# Patient Record
Sex: Male | Born: 1945 | Race: White | Hispanic: No | Smoking: Never smoker
Health system: Southern US, Community
[De-identification: ages and names within clinical notes are randomized; demographics above are authoritative.]

## PROBLEM LIST (undated history)

## (undated) DIAGNOSIS — D696 Thrombocytopenia, unspecified: Secondary | ICD-10-CM

## (undated) DIAGNOSIS — D869 Sarcoidosis, unspecified: Secondary | ICD-10-CM

## (undated) DIAGNOSIS — I2510 Atherosclerotic heart disease of native coronary artery without angina pectoris: Secondary | ICD-10-CM

## (undated) DIAGNOSIS — G250 Essential tremor: Secondary | ICD-10-CM

## (undated) DIAGNOSIS — M545 Low back pain, unspecified: Secondary | ICD-10-CM

## (undated) DIAGNOSIS — M51369 Other intervertebral disc degeneration, lumbar region without mention of lumbar back pain or lower extremity pain: Secondary | ICD-10-CM

## (undated) DIAGNOSIS — G8929 Other chronic pain: Secondary | ICD-10-CM

## (undated) DIAGNOSIS — N189 Chronic kidney disease, unspecified: Secondary | ICD-10-CM

## (undated) DIAGNOSIS — N183 Chronic kidney disease, stage 3 unspecified: Secondary | ICD-10-CM

## (undated) DIAGNOSIS — M5136 Other intervertebral disc degeneration, lumbar region: Secondary | ICD-10-CM

## (undated) DIAGNOSIS — I350 Nonrheumatic aortic (valve) stenosis: Secondary | ICD-10-CM

## (undated) DIAGNOSIS — E785 Hyperlipidemia, unspecified: Secondary | ICD-10-CM

## (undated) DIAGNOSIS — Z952 Presence of prosthetic heart valve: Secondary | ICD-10-CM

## (undated) DIAGNOSIS — I10 Essential (primary) hypertension: Secondary | ICD-10-CM

## (undated) HISTORY — DX: Atherosclerotic heart disease of native coronary artery without angina pectoris: I25.10

## (undated) HISTORY — PX: INGUINAL HERNIA REPAIR: SUR1180

## (undated) HISTORY — DX: Other intervertebral disc degeneration, lumbar region without mention of lumbar back pain or lower extremity pain: M51.369

## (undated) HISTORY — DX: Sarcoidosis, unspecified: D86.9

## (undated) HISTORY — PX: HAMMER TOE SURGERY: SHX385

## (undated) HISTORY — DX: Chronic kidney disease, stage 3 unspecified: N18.30

## (undated) HISTORY — DX: Thrombocytopenia, unspecified: D69.6

## (undated) HISTORY — DX: Other intervertebral disc degeneration, lumbar region: M51.36

## (undated) HISTORY — PX: COLONOSCOPY: SHX174

## (undated) HISTORY — DX: Essential (primary) hypertension: I10

## (undated) HISTORY — PX: LAPAROSCOPIC CHOLECYSTECTOMY: SUR755

## (undated) HISTORY — DX: Hyperlipidemia, unspecified: E78.5

---
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
## 2016-06-27 ENCOUNTER — Encounter: Payer: Self-pay | Admitting: Cardiovascular Disease

## 2016-07-13 NOTE — Progress Notes (Signed)
Cardiology Office Note   Date:  07/15/2016   ID:  Jason Anthony, DOB 1945-12-27, MRN [id number]  PCP:  Bedelia Person, MD  Cardiologist:   Charlton Haws, MD   No chief complaint on file.     History of Present Illness: Jason Anthony is a 71 y.o. male who presents for consultation regarding aortic valve disease. Seen at request of Dr Mickel Duhamel Previously followed at [facility] in [place] Chronic back pain, Thrombocytopenia, Essential Tremor, Anxiety, HTN With CRF baseline Cr around 1.5.    Reviewed echo report from [facility] 05/06/16  EF 60-65% Mild LVH Severe AS with mild AR mean gradient 36 mmHg peak 64 mmHg progression since 05/29/15  AVA No flowsheet data found.  cm2 Mild MR and mild TR  He is still working at [employer] one of my patients No chest pain dyspnea or palpitations. Active for his age     Past Medical History:  Diagnosis Date  . Acute renal failure (HCC)   . Anxiety   . Aortic stenosis   . B12 deficiency   . Bilateral impacted cerumen   . DDD (degenerative disc disease), lumbar   . DOE (dyspnea on exertion)   . Factitious dermatitis   . Gout   . Hepatitis   . Hyperglycemia   . Hyperkalemia   . Hyperlipidemia   . Hypertension   . Joint pain in the shoulder/clavicle region   . Low back pain   . Occasional tremors   . Sarcoidosis   . Thrombocytopenia (HCC)     Past Surgical History:  Procedure Laterality Date  . CHOLECYSTECTOMY    . HERNIA REPAIR    . THYROID SURGERY    . TOE SURGERY       Current Outpatient Prescriptions  Medication Sig Dispense Refill  . clonazePAM (KLONOPIN) 0.5 MG tablet Take 0.5 mg by mouth 2 (two) times daily as needed for anxiety.    Marland Kitchen losartan (COZAAR) 100 MG tablet Take 100 mg by mouth daily.    . simvastatin (ZOCOR) 40 MG tablet Take 40 mg by mouth daily.    . traZODone (DESYREL) 50 MG tablet Take 50 mg by mouth at bedtime. TAKE 1 TO 2 TABLETS BY MOUTH AT BEDTIME.    Marland Kitchen  triamcinolone cream (KENALOG) 0.1 % Apply 1 application topically 2 (two) times daily.    Marland Kitchen aspirin EC 81 MG tablet Take 1 tablet (81 mg total) by mouth daily. 90 tablet 3   No current facility-administered medications for this visit.     Allergies:   Patient has no known allergies.    Social History:  The patient  reports that he has never smoked. He has never used smokeless tobacco. He reports that he does not drink alcohol or use drugs.   Family History:  The patient's family history includes Breast cancer in his sister; CVA (age of onset: 33) in his father; Hypertension in his mother.    ROS:  Please see the history of present illness.   Otherwise, review of systems are positive for none.   All other systems are reviewed and negative.    PHYSICAL EXAM: VS:  BP 134/70   Pulse (!) 56   Ht 6\' 2"  (1.88 m)   Wt 211 lb 9.6 oz (96 kg)   BMI 27.17 kg/m  , BMI Body mass index is 27.17 kg/m. Affect appropriate Healthy:  appears stated age HEENT: normal Neck supple with no adenopathy JVP normal no  bruits no thyromegaly Lungs clear with no wheezing and good diaphragmatic motion Heart:  S1/S2 AS  murmur, no rub, gallop or click PMI normal Abdomen: benighn, BS positve, no tenderness, no AAA no bruit.  No HSM or HJR Distal pulses intact with no bruits No edema Neuro non-focal Skin warm and dry No muscular weakness    EKG:   SR rate 69 LAFB LVH   Recent Labs: No results found for requested labs within last 8760 hours.    Lipid Panel No results found for: CHOL, TRIG, HDL, CHOLHDL, VLDL, LDLCALC, LDLDIRECT    Wt Readings from Last 3 Encounters:  07/15/16 211 lb 9.6 oz (96 kg)      Other studies Reviewed: Additional studies/ records that were reviewed today include: Notes from primary Dr Sherryll BurgerShah in [place] and echo .    ASSESSMENT AND PLAN:  1. Aortic Stenosis : just borderline for gradient but essentially at severe level but asymptomatic. Lexiscan myovue To  r/o CAD F/U echo here in 6 months Explained to him that most patients become symptomatic and require Surgery within 2 years of severe AS.  2. HTN Well controlled.  Continue current medications and low sodium Dash type diet.   3. Cholesterol continue statin labs with primary    Current medicines are reviewed at length with the patient today.  The patient does not have concerns regarding medicines.  The following changes have been made:  no change  Labs/ tests ordered today include: Lex Myovue Echo in 6 months   Orders Placed This Encounter  Procedures  . Myocardial Perfusion Imaging  . EKG 12-Lead  . ECHOCARDIOGRAM COMPLETE     Disposition:   FU with me in 6 months      Signed, Charlton HawsPeter Terrence Wishon, MD  07/15/2016 2:23 PM    [facility] [address], [place], [place]  [address] Phone: [phone]; Fax: [phone]

## 2016-07-15 ENCOUNTER — Ambulatory Visit (INDEPENDENT_AMBULATORY_CARE_PROVIDER_SITE_OTHER): Payer: Medicare PPO | Admitting: Cardiovascular Disease

## 2016-07-15 ENCOUNTER — Encounter: Payer: Self-pay | Admitting: Cardiovascular Disease

## 2016-07-15 ENCOUNTER — Encounter (INDEPENDENT_AMBULATORY_CARE_PROVIDER_SITE_OTHER): Payer: Self-pay

## 2016-07-15 VITALS — BP 134/70 | HR 56 | Ht 74.0 in | Wt 211.6 lb

## 2016-07-15 DIAGNOSIS — I350 Nonrheumatic aortic (valve) stenosis: Secondary | ICD-10-CM

## 2016-07-15 MED ORDER — ASPIRIN EC 81 MG PO TBEC: 81.0000 mg | DELAYED_RELEASE_TABLET | Freq: Every day | ORAL | 3 refills | Status: DC

## 2016-07-15 NOTE — Patient Instructions (Addendum)
Medication Instructions:  Your physician recommends that you continue on your current medications as directed. Please refer to the Current Medication list given to you today.  Labwork: NONE  Testing/Procedures: Your physician has requested that you have an echocardiogram in 6 months with office visit. Echocardiography is a painless test that uses sound waves to create images of your heart. It provides your doctor with information about the size and shape of your heart and how well your heart's chambers and valves are working. This procedure takes approximately one hour. There are no restrictions for this procedure.  Your physician has requested that you have a lexiscan myoview. For further information please visit [email or web address]. Please follow instruction sheet, as given.  Follow-Up: Your physician wants you to follow-up in: 6 months with Dr. Eden EmmsNishan. You will receive a reminder letter in the mail two months in advance. If you don't receive a letter, please call our office to schedule the follow-up appointment.   If you need a refill on your cardiac medications before your next appointment, please call your pharmacy.

## 2016-07-29 ENCOUNTER — Telehealth (HOSPITAL_COMMUNITY): Payer: Self-pay | Admitting: *Deleted

## 2016-07-29 NOTE — Telephone Encounter (Signed)
Patient given detailed instructions per Myocardial Perfusion Study Information Sheet for the test on 07/31/16. Patient notified to arrive 15 minutes early and that it is imperative to arrive on time for appointment to keep from having the test rescheduled.  If you need to cancel or reschedule your appointment, please call the office within 24 hours of your appointment. . Patient verbalized understanding. Lily Kernen Jacqueline    

## 2016-07-31 ENCOUNTER — Ambulatory Visit (HOSPITAL_COMMUNITY): Payer: Medicare PPO | Attending: Internal Medicine

## 2016-07-31 DIAGNOSIS — I350 Nonrheumatic aortic (valve) stenosis: Secondary | ICD-10-CM

## 2016-07-31 DIAGNOSIS — R9439 Abnormal result of other cardiovascular function study: Secondary | ICD-10-CM | POA: Diagnosis not present

## 2016-07-31 LAB — MYOCARDIAL PERFUSION IMAGING
CHL CUP NUCLEAR SRS: 5
LV sys vol: 71 mL
LVDIAVOL: 143 mL (ref 62–150)
NUC STRESS TID: 1.1
Peak HR: 73 {beats}/min
RATE: 0.28
Rest HR: 53 {beats}/min
SDS: 3
SSS: 8

## 2016-07-31 MED ORDER — TECHNETIUM TC 99M TETROFOSMIN IV KIT
955.0000 | PACK | Freq: Once | INTRAVENOUS | Status: AC | PRN
  Administered 2016-07-31: 955 via INTRAVENOUS
  Filled 2016-07-31: qty 955

## 2016-07-31 MED ORDER — TECHNETIUM TC 99M TETROFOSMIN IV KIT
32.8000 | PACK | Freq: Once | INTRAVENOUS | Status: AC | PRN
  Administered 2016-07-31: 32.8 via INTRAVENOUS
  Filled 2016-07-31: qty 33

## 2016-07-31 MED ORDER — REGADENOSON 0.4 MG/5ML IV SOLN
0.4000 mg | Freq: Once | INTRAVENOUS | Status: AC
  Administered 2016-07-31: 0.4 mg via INTRAVENOUS

## 2016-11-22 ENCOUNTER — Telehealth (HOSPITAL_COMMUNITY): Payer: Self-pay | Admitting: Cardiovascular Disease

## 2016-11-22 NOTE — Telephone Encounter (Signed)
I called Mr. Jason Anthony and spoke with him to get him scheduled for an echo in January but he expressed that he wanted to wait until April to be scheduled due to him not knowing what the weather will be like.

## 2017-04-15 NOTE — Progress Notes (Signed)
Cardiology Office Note   Date:  04/23/2017   ID:  Jason Anthony, DOB 07/23/1945, MRN [id number]  PCP:  Bedelia PersonAaron, Caren T, MD  Cardiologist:   Charlton HawsPeter Nevin Kozuch, MD   No chief complaint on file.     History of Present Illness:  72 y.o. first seen July 2018 for aortic stenosis. Previously followed at [facility] in [place]. Co morbid conditions include chronic back pain, Thrombocytopenia, HTN , CRF with baseline CR 1.5 and Essential Tremor No history of chest pain or CAD Myovue done 07/31/16 diaphragmatic attenuation EF 51% low risk Was supposed to have f/u echo but has not been done yet.   Reviewed echo report from [facility] [place] 05/06/16  EF 60-65% Mild LVH Severe AS with mild AR mean gradient 36 mmHg peak 64 mmHg progression since 05/29/15  AVA  .61  cm2 Mild MR and mild TR  He is still working at [employer] Knows Lawrence Seaney one of my patients No chest pain dyspnea or palpitations. Active for his age Had bronchitis and  Multiple courses of prednisone and antibiotics then got C diff and was hospitalized With dehydration   Reviewed his preliminary echo from today and mean gradient is over 49 mmHg now.  Discussed need for right and left cath and risks including stroke, bleeding , contrast allergy And need for emergency surgery. His "girlfriend's" daughter is with him and can provide transportation He has concerns about his hospital bill from recent stay in [place] for C diff  Echo from today 04/23/17 Study Conclusions  - Left ventricle: The cavity size was normal. Wall thickness was   increased in a pattern of mild LVH. Systolic function was normal.   The estimated ejection fraction was in the range of 60% to 65%.   Doppler parameters are consistent with abnormal left ventricular   relaxation (grade 1 diastolic dysfunction). - Aortic valve: There was critical stenosis. There was trivial   regurgitation. Mean gradient (S): 49 mm Hg. Peak gradient (S): 90   mm  Hg. - Mitral valve: Calcified annulus. - Left atrium: The atrium was moderately dilated. - Atrial septum: No defect or patent foramen ovale was identified.  Past Medical History:  Diagnosis Date  . Acute renal failure (HCC)   . Anxiety   . Aortic stenosis   . B12 deficiency   . Bilateral impacted cerumen   . DDD (degenerative disc disease), lumbar   . DOE (dyspnea on exertion)   . Factitious dermatitis   . Gout   . Hepatitis   . Hyperglycemia   . Hyperkalemia   . Hyperlipidemia   . Hypertension   . Joint pain in the shoulder/clavicle region   . Low back pain   . Occasional tremors   . Sarcoidosis   . Thrombocytopenia (HCC)     Past Surgical History:  Procedure Laterality Date  . CHOLECYSTECTOMY    . HERNIA REPAIR    . THYROID SURGERY    . TOE SURGERY       Current Outpatient Medications  Medication Sig Dispense Refill  . aspirin EC 81 MG tablet Take 1 tablet (81 mg total) by mouth daily. 90 tablet 3  . clonazePAM (KLONOPIN) 0.5 MG tablet Take 0.5 mg by mouth 2 (two) times daily as needed for anxiety.    . simvastatin (ZOCOR) 40 MG tablet Take 40 mg by mouth daily.    . traZODone (DESYREL) 50 MG tablet Take 50 mg by mouth at bedtime. TAKE 1 TO 2 TABLETS  BY MOUTH AT BEDTIME.    Marland Kitchen losartan (COZAAR) 50 MG tablet Take 1 tablet (50 mg total) by mouth daily. 90 tablet 3   No current facility-administered medications for this visit.     Allergies:   Patient has no known allergies.    Social History:  The patient  reports that he has never smoked. He has never used smokeless tobacco. He reports that he does not drink alcohol or use drugs.   Family History:  The patient's family history includes Breast cancer in his sister; CVA (age of onset: 19) in his father; Hypertension in his mother.    ROS:  Please see the history of present illness.   Otherwise, review of systems are positive for none.   All other systems are reviewed and negative.    PHYSICAL EXAM: VS:  BP  102/60   Pulse 79   Ht 6\' 2"  (1.88 m)   Wt 203 lb (92.1 kg)   SpO2 97%   BMI 26.06 kg/m  , BMI Body mass index is 26.06 kg/m. Affect appropriate Overweight  HEENT: normal Neck supple with no adenopathy JVP normal no bruits no thyromegaly Lungs clear with no wheezing and good diaphragmatic motion Heart:  S1/S2 AS  murmur, no rub, gallop or click PMI normal Abdomen: benighn, BS positve, no tenderness, no AAA no bruit.  No HSM or HJR Distal pulses intact with no bruits No edema Neuro non-focal Skin warm and dry No muscular weakness   EKG:   SR rate 69 LAFB LVH   Recent Labs: No results found for requested labs within last 8760 hours.    Lipid Panel No results found for: CHOL, TRIG, HDL, CHOLHDL, VLDL, LDLCALC, LDLDIRECT    Wt Readings from Last 3 Encounters:  04/23/17 203 lb (92.1 kg)  07/31/16 211 lb (95.7 kg)  07/15/16 211 lb 9.6 oz (96 kg)      Other studies Reviewed: Additional studies/ records that were reviewed today include: Notes from primary Dr Sherryll Burger in [place] ECG and echo .    ASSESSMENT AND PLAN:  1. Aortic Stenosis : Severe/Critical and progressive by echo today with mean gradient 49 mmHg Have arranged right and left cath with Dr Sanjuana Kava 04/30/17 Labs today orders done will then See CVTS following week to discuss timing of surgery . Patient concerned about finances 2. HTN low with dizziness decrease cozaar to 50 mg   3. Cholesterol continue statin labs with primary    Current medicines are reviewed at length with the patient today.  The patient does not have concerns regarding medicines.  The following changes have been made:  no change  Labs/ tests ordered today include: Pre cath   Orders Placed This Encounter  Procedures  . CBC with Differential/Platelet  . Basic metabolic panel  . Protime-INR  . Ambulatory referral to Cardiothoracic Surgery  . ECHOCARDIOGRAM COMPLETE     Disposition:   FU with me post cath        Signed, Charlton Haws, MD  04/23/2017 3:39 PM    [facility] [address] [place], [place], [place]  [address] Phone: [phone]; Fax: [phone]

## 2017-04-15 NOTE — H&P (View-Only) (Signed)
Cardiology Office Note   Date:  04/23/2017   ID:  Dudley MajorJames Earlywine, DOB 07/23/1945, MRN [id number]  PCP:  Bedelia PersonAaron, Caren T, MD  Cardiologist:   Charlton HawsPeter Nishan, MD   No chief complaint on file.     History of Present Illness:  72 y.o. first seen July 2018 for aortic stenosis. Previously followed at [facility] [place]. Co morbid conditions include chronic back pain, Thrombocytopenia, HTN , CRF with baseline CR 1.5 and Essential Tremor No history of chest pain or CAD Myovue done 07/31/16 diaphragmatic attenuation EF 51% low risk Was supposed to have f/u echo but has not been done yet.   Reviewed echo report from [facility] 05/06/16  EF 60-65% Mild LVH Severe AS with mild AR mean gradient 36 mmHg peak 64 mmHg progression since 05/29/15  AVA  .61  cm2 Mild MR and mild TR  He is still working at [employer] Knows Lawrence Seaney one of my patients No chest pain dyspnea or palpitations. Active for his age Had bronchitis and  Multiple courses of prednisone and antibiotics then got C diff and was hospitalized With dehydration   Reviewed his preliminary echo from today and mean gradient is over 49 mmHg now.  Discussed need for right and left cath and risks including stroke, bleeding , contrast allergy And need for emergency surgery. His "girlfriend's" daughter is with him and can provide transportation He has concerns about his hospital bill from recent stay in [place] for C diff  Echo from today 04/23/17 Study Conclusions  - Left ventricle: The cavity size was normal. Wall thickness was   increased in a pattern of mild LVH. Systolic function was normal.   The estimated ejection fraction was in the range of 60% to 65%.   Doppler parameters are consistent with abnormal left ventricular   relaxation (grade 1 diastolic dysfunction). - Aortic valve: There was critical stenosis. There was trivial   regurgitation. Mean gradient (S): 49 mm Hg. Peak gradient (S): 90   mm  Hg. - Mitral valve: Calcified annulus. - Left atrium: The atrium was moderately dilated. - Atrial septum: No defect or patent foramen ovale was identified.  Past Medical History:  Diagnosis Date  . Acute renal failure (HCC)   . Anxiety   . Aortic stenosis   . B12 deficiency   . Bilateral impacted cerumen   . DDD (degenerative disc disease), lumbar   . DOE (dyspnea on exertion)   . Factitious dermatitis   . Gout   . Hepatitis   . Hyperglycemia   . Hyperkalemia   . Hyperlipidemia   . Hypertension   . Joint pain in the shoulder/clavicle region   . Low back pain   . Occasional tremors   . Sarcoidosis   . Thrombocytopenia (HCC)     Past Surgical History:  Procedure Laterality Date  . CHOLECYSTECTOMY    . HERNIA REPAIR    . THYROID SURGERY    . TOE SURGERY       Current Outpatient Medications  Medication Sig Dispense Refill  . aspirin EC 81 MG tablet Take 1 tablet (81 mg total) by mouth daily. 90 tablet 3  . clonazePAM (KLONOPIN) 0.5 MG tablet Take 0.5 mg by mouth 2 (two) times daily as needed for anxiety.    . simvastatin (ZOCOR) 40 MG tablet Take 40 mg by mouth daily.    . traZODone (DESYREL) 50 MG tablet Take 50 mg by mouth at bedtime. TAKE 1 TO 2 TABLETS  BY MOUTH AT BEDTIME.    Marland Kitchen losartan (COZAAR) 50 MG tablet Take 1 tablet (50 mg total) by mouth daily. 90 tablet 3   No current facility-administered medications for this visit.     Allergies:   Patient has no known allergies.    Social History:  The patient  reports that he has never smoked. He has never used smokeless tobacco. He reports that he does not drink alcohol or use drugs.   Family History:  The patient's family history includes Breast cancer in his sister; CVA (age of onset: 19) in his father; Hypertension in his mother.    ROS:  Please see the history of present illness.   Otherwise, review of systems are positive for none.   All other systems are reviewed and negative.    PHYSICAL EXAM: VS:  BP  102/60   Pulse 79   Ht 6\' 2"  (1.88 m)   Wt 203 lb (92.1 kg)   SpO2 97%   BMI 26.06 kg/m  , BMI Body mass index is 26.06 kg/m. Affect appropriate Overweight  HEENT: normal Neck supple with no adenopathy JVP normal no bruits no thyromegaly Lungs clear with no wheezing and good diaphragmatic motion Heart:  S1/S2 AS  murmur, no rub, gallop or click PMI normal Abdomen: benighn, BS positve, no tenderness, no AAA no bruit.  No HSM or HJR Distal pulses intact with no bruits No edema Neuro non-focal Skin warm and dry No muscular weakness   EKG:   SR rate 69 LAFB LVH   Recent Labs: No results found for requested labs within last 8760 hours.    Lipid Panel No results found for: CHOL, TRIG, HDL, CHOLHDL, VLDL, LDLCALC, LDLDIRECT    Wt Readings from Last 3 Encounters:  04/23/17 203 lb (92.1 kg)  07/31/16 211 lb (95.7 kg)  07/15/16 211 lb 9.6 oz (96 kg)      Other studies Reviewed: Additional studies/ records that were reviewed today include: Notes from primary Dr Sherryll Burger in [place] ECG and echo .    ASSESSMENT AND PLAN:  1. Aortic Stenosis : Severe/Critical and progressive by echo today with mean gradient 49 mmHg Have arranged right and left cath with Dr Sanjuana Kava 04/30/17 Labs today orders done will then See CVTS following week to discuss timing of surgery . Patient concerned about finances 2. HTN low with dizziness decrease cozaar to 50 mg   3. Cholesterol continue statin labs with primary    Current medicines are reviewed at length with the patient today.  The patient does not have concerns regarding medicines.  The following changes have been made:  no change  Labs/ tests ordered today include: Pre cath   Orders Placed This Encounter  Procedures  . CBC with Differential/Platelet  . Basic metabolic panel  . Protime-INR  . Ambulatory referral to Cardiothoracic Surgery  . ECHOCARDIOGRAM COMPLETE     Disposition:   FU with me post cath        Signed, Charlton Haws, MD  04/23/2017 3:39 PM    [facility] [address] [place], [place], [place]  [address] Phone: [phone]; Fax: [phone]

## 2017-04-23 ENCOUNTER — Encounter: Payer: Self-pay | Admitting: Cardiovascular Disease

## 2017-04-23 ENCOUNTER — Ambulatory Visit (HOSPITAL_COMMUNITY): Payer: Medicare PPO | Attending: Cardiology

## 2017-04-23 ENCOUNTER — Ambulatory Visit: Payer: Medicare PPO | Admitting: Cardiovascular Disease

## 2017-04-23 ENCOUNTER — Other Ambulatory Visit: Payer: Self-pay

## 2017-04-23 ENCOUNTER — Other Ambulatory Visit (HOSPITAL_COMMUNITY): Payer: Self-pay | Admitting: Cardiovascular Disease

## 2017-04-23 VITALS — BP 102/60 | HR 79 | Ht 74.0 in | Wt 203.0 lb

## 2017-04-23 DIAGNOSIS — E7849 Other hyperlipidemia: Secondary | ICD-10-CM

## 2017-04-23 DIAGNOSIS — R0600 Dyspnea, unspecified: Secondary | ICD-10-CM | POA: Diagnosis not present

## 2017-04-23 DIAGNOSIS — E785 Hyperlipidemia, unspecified: Secondary | ICD-10-CM | POA: Diagnosis not present

## 2017-04-23 DIAGNOSIS — I119 Hypertensive heart disease without heart failure: Secondary | ICD-10-CM | POA: Diagnosis not present

## 2017-04-23 DIAGNOSIS — I10 Essential (primary) hypertension: Secondary | ICD-10-CM

## 2017-04-23 DIAGNOSIS — I350 Nonrheumatic aortic (valve) stenosis: Secondary | ICD-10-CM | POA: Diagnosis present

## 2017-04-23 DIAGNOSIS — D869 Sarcoidosis, unspecified: Secondary | ICD-10-CM | POA: Insufficient documentation

## 2017-04-23 LAB — ECHOCARDIOGRAM COMPLETE
Height: 74 in
Weight: 3248 oz

## 2017-04-23 MED ORDER — LOSARTAN POTASSIUM 50 MG PO TABS: 50.0000 mg | ORAL_TABLET | Freq: Every day | ORAL | 3 refills | Status: DC

## 2017-04-23 NOTE — Patient Instructions (Addendum)
Medication Instructions:  Your physician has recommended you make the following change in your medication:  1-Losartan 50 mg by mouth daily.   Labwork: Your physician recommends that you have lab work done today- BMET, CBC, PT/INR  Testing/Procedures: Your physician has requested that you have a cardiac catheterization. Cardiac catheterization is used to diagnose and/or treat various heart conditions. Doctors may recommend this procedure for a number of different reasons. The most common reason is to evaluate chest pain. Chest pain can be a symptom of coronary artery disease (CAD), and cardiac catheterization can show whether plaque is narrowing or blocking your heart's arteries. This procedure is also used to evaluate the valves, as well as measure the blood flow and oxygen levels in different parts of your heart. For further information please visit [email or web address] Please follow instruction sheet, as given.  Follow-Up: Your physician wants you to follow-up in: 3 weeks with PA or NP.  You have been referred to Dr. Cornelius Moraswen for Aortic Stenosis.   If you need a refill on your cardiac medications before your next appointment, please call your pharmacy.    [facility] [address] [address] OFF[facility] [address], [address] [place] [address] Dept: [phone] Loc: [phone]  Dudley MajorJames Anschutz  04/23/2017  You are scheduled for a Cardiac Catheterization on Wednesday, April 24 with Dr. Verne Carrowhristopher McAlhany.  1. [facility] at [facility] : [address] [place], [place] [address] at 6:30 AM (two hours before your procedure to ensure your preparation). Free valet parking service is available.   Special note: Every effort is made to have your procedure done on time. Please understand that emergencies sometimes delay scheduled procedures.  2. Diet: Do not eat or drink anything after  midnight prior to your procedure except sips of water to take medications.  3. Labs: None needed.  4. Medication instructions in preparation for your procedure:  On the morning of your procedure, take your Aspirin and any morning medicines NOT listed above.  You may use sips of water.  5. Plan for one night stay--bring personal belongings. 6. Bring a current list of your medications and current insurance cards. 7. You MUST have a responsible person to drive you home. 8. Someone MUST be with you the first 24 hours after you arrive home or your discharge will be delayed. 9. Please wear clothes that are easy to get on and off and wear slip-on shoes.  Thank you for allowing us to care for you!   -- [facility]

## 2017-04-24 ENCOUNTER — Telehealth: Payer: Self-pay | Admitting: Cardiology

## 2017-04-24 LAB — CBC WITH DIFFERENTIAL/PLATELET
BASOS: 0 %
Basophils Absolute: 0 10*3/uL (ref 0.0–0.2)
EOS (ABSOLUTE): 0.1 10*3/uL (ref 0.0–0.4)
Eos: 2 %
HEMATOCRIT: 36.7 % — AB (ref 37.5–51.0)
Hemoglobin: 11.8 g/dL — ABNORMAL LOW (ref 13.0–17.7)
Immature Grans (Abs): 0 10*3/uL (ref 0.0–0.1)
Immature Granulocytes: 0 %
LYMPHS ABS: 1.1 10*3/uL (ref 0.7–3.1)
Lymphs: 19 %
MCH: 31.6 pg (ref 26.6–33.0)
MCHC: 32.2 g/dL (ref 31.5–35.7)
MCV: 98 fL — AB (ref 79–97)
MONOS ABS: 0.5 10*3/uL (ref 0.1–0.9)
Monocytes: 8 %
NEUTROS ABS: 4.1 10*3/uL (ref 1.4–7.0)
Neutrophils: 71 %
Platelets: 123 10*3/uL — ABNORMAL LOW (ref 150–379)
RBC: 3.74 x10E6/uL — ABNORMAL LOW (ref 4.14–5.80)
RDW: 14.1 % (ref 12.3–15.4)
WBC: 5.8 10*3/uL (ref 3.4–10.8)

## 2017-04-24 LAB — BASIC METABOLIC PANEL
BUN/Creatinine Ratio: 12 (ref 10–24)
BUN: 17 mg/dL (ref 8–27)
CO2: 27 mmol/L (ref 20–29)
CREATININE: 1.47 mg/dL — AB (ref 0.76–1.27)
Calcium: 9.7 mg/dL (ref 8.6–10.2)
Chloride: 103 mmol/L (ref 96–106)
GFR calc non Af Amer: 46 mL/min/{1.73_m2} — ABNORMAL LOW (ref >59)
GFR, EST AFRICAN AMERICAN: 54 mL/min/{1.73_m2} — AB (ref >59)
Glucose: 98 mg/dL (ref 65–99)
POTASSIUM: 5.5 mmol/L — AB (ref 3.5–5.2)
SODIUM: 144 mmol/L (ref 134–144)

## 2017-04-24 LAB — PROTIME-INR
INR: 1 (ref 0.8–1.2)
PROTHROMBIN TIME: 10.8 s (ref 9.1–12.0)

## 2017-04-24 NOTE — Telephone Encounter (Signed)
Patient's daughter Kendal HymenBonnie was wondering if she would need to stay at the hospital the whole time while patient is having the heart cath. Informed Kendal HymenBonnie, that she should be able to leave and come back if needed. Encouraged Kendal HymenBonnie to ask someone when they are signing in at short stay the morning of procedure. Kendal HymenBonnie verbalized understanding and will ask.

## 2017-04-24 NOTE — Telephone Encounter (Signed)
New Message:     Pt's daughter calling with some follow up questions about procedure to be done one 4/24

## 2017-04-29 ENCOUNTER — Telehealth: Payer: Self-pay | Admitting: *Deleted

## 2017-04-29 NOTE — Telephone Encounter (Signed)
Pt contacted pre-catheterization scheduled at [facility] for: Wednesday April 30, 2017 8:30 AM Verified arrival time and place: [facility] A[facility] Tower at: 6:30 AM Nothing to eat or drink after midnight prior to procedure. Verified no diabetes medications. Verified no known allergies.  Hold: Losartan today until post cath.  Except hold medications AM meds can be  taken pre-cath with sip of water including: ASA 81 mg  Confirmed patient has responsible person to drive home post procedure and observe patient for 24 hours: pt does have someone to drive him to and from the hospital but did not have responsible person to observe for 24 hours post procedure. I will send message to cath lab to let them know pt does no have responsible person to observe him post procedure.

## 2017-04-30 ENCOUNTER — Other Ambulatory Visit: Payer: Self-pay

## 2017-04-30 ENCOUNTER — Ambulatory Visit (HOSPITAL_COMMUNITY)
Admission: RE | Admit: 2017-04-30 | Discharge: 2017-05-01 | Disposition: A | Discharge status: Home / Self Care | Payer: Medicare PPO | Source: Ambulatory Visit | Attending: Cardiovascular Disease | Admitting: Cardiovascular Disease

## 2017-04-30 ENCOUNTER — Encounter (HOSPITAL_COMMUNITY): Payer: Self-pay | Admitting: General Practice

## 2017-04-30 ENCOUNTER — Encounter (HOSPITAL_COMMUNITY)
Admission: RE | Disposition: A | Discharge status: Home / Self Care | Payer: Self-pay | Source: Ambulatory Visit | Attending: Cardiovascular Disease

## 2017-04-30 DIAGNOSIS — G8929 Other chronic pain: Secondary | ICD-10-CM | POA: Diagnosis not present

## 2017-04-30 DIAGNOSIS — I350 Nonrheumatic aortic (valve) stenosis: Secondary | ICD-10-CM | POA: Diagnosis not present

## 2017-04-30 DIAGNOSIS — D869 Sarcoidosis, unspecified: Secondary | ICD-10-CM | POA: Diagnosis not present

## 2017-04-30 DIAGNOSIS — E538 Deficiency of other specified B group vitamins: Secondary | ICD-10-CM | POA: Diagnosis not present

## 2017-04-30 DIAGNOSIS — G250 Essential tremor: Secondary | ICD-10-CM | POA: Insufficient documentation

## 2017-04-30 DIAGNOSIS — I129 Hypertensive chronic kidney disease with stage 1 through stage 4 chronic kidney disease, or unspecified chronic kidney disease: Secondary | ICD-10-CM | POA: Insufficient documentation

## 2017-04-30 DIAGNOSIS — Z7982 Long term (current) use of aspirin: Secondary | ICD-10-CM | POA: Insufficient documentation

## 2017-04-30 DIAGNOSIS — Z8249 Family history of ischemic heart disease and other diseases of the circulatory system: Secondary | ICD-10-CM | POA: Diagnosis not present

## 2017-04-30 DIAGNOSIS — M5136 Other intervertebral disc degeneration, lumbar region: Secondary | ICD-10-CM | POA: Diagnosis not present

## 2017-04-30 DIAGNOSIS — E785 Hyperlipidemia, unspecified: Secondary | ICD-10-CM | POA: Insufficient documentation

## 2017-04-30 DIAGNOSIS — Z823 Family history of stroke: Secondary | ICD-10-CM | POA: Diagnosis not present

## 2017-04-30 DIAGNOSIS — D696 Thrombocytopenia, unspecified: Secondary | ICD-10-CM | POA: Diagnosis not present

## 2017-04-30 DIAGNOSIS — N189 Chronic kidney disease, unspecified: Secondary | ICD-10-CM | POA: Diagnosis not present

## 2017-04-30 DIAGNOSIS — I2510 Atherosclerotic heart disease of native coronary artery without angina pectoris: Secondary | ICD-10-CM

## 2017-04-30 DIAGNOSIS — M109 Gout, unspecified: Secondary | ICD-10-CM | POA: Diagnosis not present

## 2017-04-30 DIAGNOSIS — F419 Anxiety disorder, unspecified: Secondary | ICD-10-CM | POA: Diagnosis not present

## 2017-04-30 HISTORY — DX: Low back pain, unspecified: M54.50

## 2017-04-30 HISTORY — DX: Other chronic pain: G89.29

## 2017-04-30 HISTORY — DX: Low back pain: M54.5

## 2017-04-30 HISTORY — PX: RIGHT/LEFT HEART CATH AND CORONARY ANGIOGRAPHY: CATH118266

## 2017-04-30 HISTORY — DX: Nonrheumatic aortic (valve) stenosis: I35.0

## 2017-04-30 HISTORY — PX: CARDIAC CATHETERIZATION: SHX172

## 2017-04-30 LAB — POCT I-STAT 3, VENOUS BLOOD GAS (G3P V)
Acid-base deficit: 3 mmol/L — ABNORMAL HIGH (ref 0.0–2.0)
Bicarbonate: 23.5 mmol/L (ref 20.0–28.0)
O2 Saturation: 72 %
TCO2: 25 mmol/L (ref 22–32)
pCO2, Ven: 47.7 mmHg (ref 44.0–60.0)
pH, Ven: 7.3 (ref 7.250–7.430)
pO2, Ven: 42 mmHg (ref 32.0–45.0)

## 2017-04-30 LAB — BASIC METABOLIC PANEL
Anion gap: 9 (ref 5–15)
BUN: 15 mg/dL (ref 6–20)
CALCIUM: 9 mg/dL (ref 8.9–10.3)
CHLORIDE: 106 mmol/L (ref 101–111)
CO2: 25 mmol/L (ref 22–32)
Creatinine, Ser: 1.44 mg/dL — ABNORMAL HIGH (ref 0.61–1.24)
GFR, EST AFRICAN AMERICAN: 54 mL/min — AB (ref >60)
GFR, EST NON AFRICAN AMERICAN: 46 mL/min — AB (ref >60)
Glucose, Bld: 89 mg/dL (ref 65–99)
Potassium: 3.7 mmol/L (ref 3.5–5.1)
SODIUM: 140 mmol/L (ref 135–145)

## 2017-04-30 SURGERY — RIGHT/LEFT HEART CATH AND CORONARY ANGIOGRAPHY
Anesthesia: LOCAL

## 2017-04-30 MED ORDER — SODIUM CHLORIDE 0.9 % WEIGHT BASED INFUSION
3.0000 mL/kg/h | INTRAVENOUS | Status: DC
  Administered 2017-04-30: 3 mL/kg/h via INTRAVENOUS

## 2017-04-30 MED ORDER — MIDAZOLAM HCL 2 MG/2ML IJ SOLN
INTRAMUSCULAR | Status: DC | PRN
  Administered 2017-04-30: 1 mg via INTRAVENOUS

## 2017-04-30 MED ORDER — HEPARIN SODIUM (PORCINE) 1000 UNIT/ML IJ SOLN
INTRAMUSCULAR | Status: DC | PRN
  Administered 2017-04-30: 4500 [IU] via INTRAVENOUS

## 2017-04-30 MED ORDER — HEPARIN (PORCINE) IN NACL 2-0.9 UNITS/ML
INTRAMUSCULAR | Status: AC | PRN
  Administered 2017-04-30 (×2): 500 mL

## 2017-04-30 MED ORDER — IOHEXOL 350 MG/ML SOLN
INTRAVENOUS | Status: DC | PRN
  Administered 2017-04-30: 60 mL via INTRA_ARTERIAL

## 2017-04-30 MED ORDER — CLONAZEPAM 0.5 MG PO TABS: 0.5000 mg | ORAL_TABLET | Freq: Two times a day (BID) | ORAL | Status: DC | PRN

## 2017-04-30 MED ORDER — LOSARTAN POTASSIUM 50 MG PO TABS
50.0000 mg | ORAL_TABLET | Freq: Every day | ORAL | Status: DC
  Administered 2017-05-01: 50 mg via ORAL
  Filled 2017-04-30: qty 1

## 2017-04-30 MED ORDER — LIDOCAINE HCL (PF) 1 % IJ SOLN
INTRAMUSCULAR | Status: DC | PRN
  Administered 2017-04-30: 5 mL via SUBCUTANEOUS

## 2017-04-30 MED ORDER — SODIUM CHLORIDE 0.9% FLUSH
3.0000 mL | Freq: Two times a day (BID) | INTRAVENOUS | Status: DC
  Administered 2017-04-30: 22:00:00 3 mL via INTRAVENOUS

## 2017-04-30 MED ORDER — TRAZODONE HCL 50 MG PO TABS
100.0000 mg | ORAL_TABLET | Freq: Every day | ORAL | Status: DC
Start: 2017-04-30 — End: 2017-05-01
  Administered 2017-04-30: 100 mg via ORAL
  Filled 2017-04-30: qty 2

## 2017-04-30 MED ORDER — FENTANYL CITRATE (PF) 100 MCG/2ML IJ SOLN
INTRAMUSCULAR | Status: DC | PRN
  Administered 2017-04-30: 25 ug via INTRAVENOUS

## 2017-04-30 MED ORDER — ASPIRIN EC 81 MG PO TBEC
81.0000 mg | DELAYED_RELEASE_TABLET | Freq: Every day | ORAL | Status: DC
  Administered 2017-05-01: 81 mg via ORAL
  Filled 2017-04-30: qty 1

## 2017-04-30 MED ORDER — SODIUM CHLORIDE 0.9% FLUSH: 3.0000 mL | INTRAVENOUS | Status: DC | PRN

## 2017-04-30 MED ORDER — FENTANYL CITRATE (PF) 100 MCG/2ML IJ SOLN
INTRAMUSCULAR | Status: AC
Start: 2017-04-30 — End: ?
  Filled 2017-04-30: qty 2

## 2017-04-30 MED ORDER — VERAPAMIL HCL 2.5 MG/ML IV SOLN
INTRAVENOUS | Status: DC | PRN
  Administered 2017-04-30: 10 mL via INTRA_ARTERIAL

## 2017-04-30 MED ORDER — SODIUM CHLORIDE 0.9% FLUSH: 3.0000 mL | Freq: Two times a day (BID) | INTRAVENOUS | Status: DC

## 2017-04-30 MED ORDER — LIDOCAINE HCL (PF) 1 % IJ SOLN
INTRAMUSCULAR | Status: AC
  Filled 2017-04-30: qty 30

## 2017-04-30 MED ORDER — SODIUM CHLORIDE 0.9 % IV SOLN: INTRAVENOUS | Status: AC

## 2017-04-30 MED ORDER — VERAPAMIL HCL 2.5 MG/ML IV SOLN
INTRAVENOUS | Status: AC
  Filled 2017-04-30: qty 2

## 2017-04-30 MED ORDER — ACETAMINOPHEN 325 MG PO TABS: 650.0000 mg | ORAL_TABLET | ORAL | Status: DC | PRN

## 2017-04-30 MED ORDER — ASPIRIN 81 MG PO CHEW: 81.0000 mg | CHEWABLE_TABLET | ORAL | Status: DC

## 2017-04-30 MED ORDER — SODIUM CHLORIDE 0.9 % IV SOLN: 250.0000 mL | INTRAVENOUS | Status: DC | PRN

## 2017-04-30 MED ORDER — SODIUM CHLORIDE 0.9 % WEIGHT BASED INFUSION: 1.0000 mL/kg/h | INTRAVENOUS | Status: DC

## 2017-04-30 MED ORDER — ONDANSETRON HCL 4 MG/2ML IJ SOLN: 4.0000 mg | Freq: Four times a day (QID) | INTRAMUSCULAR | Status: DC | PRN

## 2017-04-30 MED ORDER — MIDAZOLAM HCL 2 MG/2ML IJ SOLN
INTRAMUSCULAR | Status: AC
  Filled 2017-04-30: qty 2

## 2017-04-30 MED ORDER — SIMVASTATIN 20 MG PO TABS
40.0000 mg | ORAL_TABLET | Freq: Every day | ORAL | Status: DC
  Administered 2017-04-30: 40 mg via ORAL
  Filled 2017-04-30: qty 2

## 2017-04-30 MED ORDER — HEPARIN (PORCINE) IN NACL 1000-0.9 UT/500ML-% IV SOLN
INTRAVENOUS | Status: AC
  Filled 2017-04-30: qty 1000

## 2017-04-30 MED ORDER — HEPARIN SODIUM (PORCINE) 1000 UNIT/ML IJ SOLN
INTRAMUSCULAR | Status: AC
  Filled 2017-04-30: qty 1

## 2017-04-30 SURGICAL SUPPLY — 16 items
BAND ZEPHYR COMPRESS 30 LONG (HEMOSTASIS) ×2 IMPLANT
CATH BALLN WEDGE 5F 110CM (CATHETERS) ×2 IMPLANT
CATH INFINITI 5 FR JL3.5 (CATHETERS) ×2 IMPLANT
CATH INFINITI 5FR AL1 (CATHETERS) ×4 IMPLANT
CATH INFINITI JR4 5F (CATHETERS) ×2 IMPLANT
GUIDEWIRE .025 260CM (WIRE) ×2 IMPLANT
GUIDEWIRE INQWIRE 1.5J.035X260 (WIRE) ×1 IMPLANT
INQWIRE 1.5J .035X260CM (WIRE) ×2
KIT HEART LEFT (KITS) ×2 IMPLANT
NEEDLE PERC 21GX4CM (NEEDLE) ×2 IMPLANT
PACK CARDIAC CATHETERIZATION (CUSTOM PROCEDURE TRAY) ×2 IMPLANT
SHEATH RAIN 4/5FR (SHEATH) ×2 IMPLANT
SHEATH RAIN RADIAL 21G 6FR (SHEATH) ×2 IMPLANT
TRANSDUCER W/STOPCOCK (MISCELLANEOUS) ×2 IMPLANT
TUBING CIL FLEX 10 FLL-RA (TUBING) ×2 IMPLANT
WIRE EMERALD ST .035X150CM (WIRE) ×2 IMPLANT

## 2017-04-30 NOTE — Interval H&P Note (Signed)
History and Physical Interval Note:  04/30/2017 8:04 AM  Dudley MajorJames Howton  has presented today for surgery, with the diagnosis of aortic stenosis  The various methods of treatment have been discussed with the patient and family. After consideration of risks, benefits and other options for treatment, the patient has consented to  Procedure(s): RIGHT/LEFT HEART CATH AND CORONARY ANGIOGRAPHY (N/A) as a surgical intervention .  The patient's history has been reviewed, patient examined, no change in status, stable for surgery.  I have reviewed the patient's chart and labs.  Questions were answered to the patient's satisfaction.    Cath Lab Visit (complete for each Cath Lab visit)  Clinical Evaluation Leading to the Procedure:   ACS: No.  Non-ACS:    Anginal Classification: CCS II  Anti-ischemic medical therapy: No Therapy  Non-Invasive Test Results: No non-invasive testing performed  Prior CABG: No previous CABG         Verne Carrowhristopher Dalma Panchal

## 2017-05-01 ENCOUNTER — Encounter (HOSPITAL_COMMUNITY): Payer: Self-pay | Admitting: Cardiovascular Disease

## 2017-05-01 DIAGNOSIS — I350 Nonrheumatic aortic (valve) stenosis: Secondary | ICD-10-CM | POA: Diagnosis not present

## 2017-05-01 LAB — BASIC METABOLIC PANEL
Anion gap: 8 (ref 5–15)
BUN: 12 mg/dL (ref 6–20)
CO2: 26 mmol/L (ref 22–32)
CREATININE: 1.41 mg/dL — AB (ref 0.61–1.24)
Calcium: 8.7 mg/dL — ABNORMAL LOW (ref 8.9–10.3)
Chloride: 104 mmol/L (ref 101–111)
GFR calc Af Amer: 55 mL/min — ABNORMAL LOW (ref >60)
GFR, EST NON AFRICAN AMERICAN: 48 mL/min — AB (ref >60)
Glucose, Bld: 84 mg/dL (ref 65–99)
Potassium: 4.4 mmol/L (ref 3.5–5.1)
SODIUM: 138 mmol/L (ref 135–145)

## 2017-05-01 LAB — POCT I-STAT 3, ART BLOOD GAS (G3+)
Bicarbonate: 26.1 mmol/L (ref 20.0–28.0)
O2 Saturation: 98 %
PCO2 ART: 48.3 mmHg — AB (ref 32.0–48.0)
PO2 ART: 123 mmHg — AB (ref 83.0–108.0)
TCO2: 28 mmol/L (ref 22–32)
pH, Arterial: 7.341 — ABNORMAL LOW (ref 7.350–7.450)

## 2017-05-01 NOTE — Discharge Summary (Signed)
Discharge Summary    Patient ID: Jason Anthony,  MRN: [id number], DOB/AGE: 1945-08-29 72 y.o.  Admit date: 04/30/2017 Discharge date: 05/01/2017  Primary Care Provider: Fanny Dance Anthony Primary Cardiologist: Dr. Eden Anthony [facility])  Discharge Diagnoses    Active Problems:   Aortic stenosis, severe   Severe aortic stenosis   Allergies No Known Allergies  Diagnostic Studies/Procedures    Cath: 04/30/17  Conclusion     Ost RCA to Prox RCA lesion is 30% stenosed.  Prox RCA to Mid RCA lesion is 20% stenosed.  Ost RPDA lesion is 30% stenosed.  Prox Cx lesion is 60% stenosed.  Dist LAD lesion is 80% stenosed.  Prox LAD lesion is 50% stenosed.  Prox LAD to Mid LAD lesion is 30% stenosed.  Ost 1st Diag lesion is 60% stenosed.  There is severe aortic valve stenosis.  Ost 2nd Mrg lesion is 30% stenosed.  Mid Cx lesion is 30% stenosed.   1. The Left main has no obstructive disease 2. The LAD is a large caliber vessel in the proximal and mid vessel and then becomes smaller in caliber in the distal vessel. The mid LAD has a moderate stenosis that does not appear to be flow limiting. The distal LAD is smaller in caliber. There is a focal severe stenosis in the distal LAD. This vessel appears to be too small for stenting.  3. The Circumflex is a large caliber vessel with a moderate proximal stenosis just before the takeoff of a large obtuse marginal branch. The large caliber first obtuse marginal branch is free of obstructive disease.  4. The RCA is a large, dominant vessel with mild ostial stenosis, mild disease in the mid and distal vessel. There is mild disease in the moderate caliber PDA.  5. Severe aortic stenosis (mean gradient 42.1 mmHg, peak to peak gradient 49 mmHg, AVA 1.2 cm2).   Recommendations: I think his CAD can be managed at this time with medical therapy. He is having no angina. The LAD lesion is distal and the vessel is small in caliber (not favorable for  stenting) and this only supplies the apical segment of myocardium. The Circumflex lesion is moderate. Will proceed with workup for AVR vs TAVR. He has an appointment with Dr. Cornelius Anthony next week. I suspect that he will be a good TAVR candidate.   _____________   History of Present Illness     72 yo male with PMH of AS, back pain, HTN, thrombocytopenia, CRF with baseline Cr 1.5, and essential tremor who presented to the office on 04/23/17 with Dr. Eden Anthony to discuss recent echo.   Report from [facility] 05/06/16  EF 60-65% Mild LVH Severe AS with mild AR mean gradient 36 mmHg peak 64 mmHg progression since 05/29/15  AVA  .61  cm2 Mild MR and mild TR  Echo from 04/23/17 Study Conclusions  - Left ventricle: The cavity size was normal. Wall thickness was increased in a pattern of mild LVH. Systolic function was normal. The estimated ejection fraction was in the range of 60% to 65%. Doppler parameters are consistent with abnormal left ventricular relaxation (grade 1 diastolic dysfunction). - Aortic valve: There was critical stenosis. There was trivial regurgitation. Mean gradient (S): 49 mm Hg. Peak gradient (S): 90 mm Hg. - Mitral valve: Calcified annulus. - Left atrium: The atrium was moderately dilated. - Atrial septum: No defect or patent foramen ovale was identified.  He works at [employer] as a Holiday representative. No chest pain dyspnea or  palpitations. Active for his age. Dr. Eden EmmsNishan discussed need for right and left cath and risks including stroke, bleeding , contrast allergy in regards to further work up for the AS. He was set up for this as an outpatient.   Hospital Course     Underwent cath noted above with diffuse nonobstructive CAD, with the exception of focal stenosis in the distal LAD which was a small caliber vessel to small for stenting. Also noted severe AS with mean gradient of 42.441mmHg. It was felt his CAD could be managed medically as he has had no angina. Plan is  to continue his work up for AVR vs TAVR. No complications noted post cath. Morning labs stable.   General: Well developed, well nourished, male appearing in no acute distress. Head: Normocephalic, atraumatic.  Neck: Supple without bruits, JVD. Lungs:  Resp regular and unlabored, CTA. Heart: RRR, S1, S2, no S3, S4, 3/6 systolic murmur; no rub. Abdomen: Soft, non-tender, non-distended with normoactive bowel sounds. No hepatomegaly. No rebound/guarding. No obvious abdominal masses. Extremities: No clubbing, cyanosis, edema. Distal pedal pulses are 2+ bilaterally. R radial cath site stable without bruising or hematoma Neuro: Alert and oriented X 3. Moves all extremities spontaneously. Psych: Normal affect.  Jason Anthony was seen by Dr. [place] and determined stable for discharge home. Follow up in the office has been arranged. Medications are listed below.   _____________  Discharge Vitals Blood pressure 126/66, pulse 64, temperature 97.8 F (36.6 C), temperature source Oral, resp. rate (!) 23, height 6\' 2"  (1.88 m), weight 202 lb 13.2 oz (92 kg), SpO2 95 %.  Filed Weights   04/30/17 0634 05/01/17 0352  Weight: 205 lb (93 kg) 202 lb 13.2 oz (92 kg)    Labs & Radiologic Studies    CBC No results for input(s): WBC, NEUTROABS, HGB, HCT, MCV, PLT in the last 72 hours. Basic Metabolic Panel Recent Labs    16/10/9602/24/19 0723 05/01/17 0335  NA 140 138  K 3.7 4.4  CL 106 104  CO2 25 26  GLUCOSE 89 84  BUN 15 12  CREATININE 1.44* 1.41*  CALCIUM 9.0 8.7*   Liver Function Tests No results for input(s): AST, ALT, ALKPHOS, BILITOT, PROT, ALBUMIN in the last 72 hours. No results for input(s): LIPASE, AMYLASE in the last 72 hours. Cardiac Enzymes No results for input(s): CKTOTAL, CKMB, CKMBINDEX, TROPONINI in the last 72 hours. BNP Invalid input(s): POCBNP D-Dimer No results for input(s): DDIMER in the last 72 hours. Hemoglobin A1C No results for input(s): HGBA1C in the last 72  hours. Fasting Lipid Panel No results for input(s): CHOL, HDL, LDLCALC, TRIG, CHOLHDL, LDLDIRECT in the last 72 hours. Thyroid Function Tests No results for input(s): TSH, T4TOTAL, T3FREE, THYROIDAB in the last 72 hours.  Invalid input(s): FREET3 _____________  No results found. Disposition   Pt is being discharged home today in good condition.  Follow-up Plans & Appointments    Follow-up Information    Purcell Nailswen, Clarence H, MD Follow up on 05/16/2017.   Specialty:  Cardiothoracic Surgery Why:  at 9:30am for your appt with the heart surgeon  Contact information: [address] [place] [address] [phone]          Discharge Instructions    Call MD for:  redness, tenderness, or signs of infection (pain, swelling, redness, odor or green/yellow discharge around incision site)   Complete by:  As directed    Diet - low sodium heart healthy   Complete by:  As directed    Increase activity slowly   Complete by:  As directed       Discharge Medications     Medication List    TAKE these medications   aspirin EC 81 MG tablet Take 1 tablet (81 mg total) by mouth daily.   clonazePAM 0.5 MG tablet Commonly known as:  KLONOPIN Take 0.5 mg by mouth 2 (two) times daily as needed for anxiety.   losartan 50 MG tablet Commonly known as:  COZAAR Take 1 tablet (50 mg total) by mouth daily.   simvastatin 40 MG tablet Commonly known as:  ZOCOR Take 40 mg by mouth daily.   traZODone 50 MG tablet Commonly known as:  DESYREL Take 100 mg by mouth at bedtime.       Outstanding Labs/Studies   Keep follow up appts in regards to valve work up.   Duration of Discharge Encounter   Greater than 30 minutes including physician time.  Signed, Laverda Page NP-C 05/01/2017, 9:11 AM

## 2017-05-02 ENCOUNTER — Telehealth: Payer: Self-pay | Admitting: Cardiovascular Disease

## 2017-05-02 MED FILL — Heparin Sod (Porcine)-NaCl IV Soln 1000 Unit/500ML-0.9%: INTRAVENOUS | Qty: 1000 | Status: AC

## 2017-05-02 NOTE — Telephone Encounter (Signed)
Spoke to Mr. Jason Anthony. Starting this morning he has very slight tingling in right hand/fingers. Hand/wrist is purple, very bruised as it was when he was discharged.  Normal ROM and temperature. Swelling has gone down since discharge.  Cath site is clean, dry, intact.  Advised patient to monitor and to call back early next week if symptoms does not improve by then or get worse.  Advised I will forward to his cardiologist to make him aware.

## 2017-05-02 NOTE — Telephone Encounter (Signed)
Pt calling:  Pt had cath done 4.24.19 and is having slight tingling in his right hand. Pt want to know if he should be concerned. Please cal pt.

## 2017-05-12 ENCOUNTER — Telehealth: Payer: Self-pay | Admitting: Cardiovascular Disease

## 2017-05-12 ENCOUNTER — Encounter: Payer: Medicare PPO | Admitting: Thoracic Surgery (Cardiothoracic Vascular Surgery)

## 2017-05-12 NOTE — Telephone Encounter (Addendum)
Called patient back. Patient stated his BP was low last night at 89/60. Patient stated he saw his PCP today and his BP was 135/75. Patient stated his PCP decreased his Losartan to 25 mg by mouth daily. Patient stated he was told to hold Losartan today, and start tomorrow. Encouraged patient to follow his PCP recommendations and to give our office a call if he has any other issues. Will update patient's medication list. Will forward to Dr. Eden Emms, so he is aware.

## 2017-05-12 NOTE — Telephone Encounter (Signed)
New Message   Pt c/o BP issue:  1. What are your last 5 BP readings? 111/69 2. Are you having any other symptoms (ex. Dizziness, headache, blurred vision, passed out)? no 3. What is your medication issue? None   Patient states that he returned to work yesterday and when he checked his BP it was in the low 80's. He then ate a hamburger and it was 111/69 but then it went back to the low 80's. Please call to discuss.

## 2017-05-14 ENCOUNTER — Other Ambulatory Visit: Payer: Self-pay

## 2017-05-14 ENCOUNTER — Encounter: Payer: Self-pay | Admitting: Thoracic Surgery (Cardiothoracic Vascular Surgery)

## 2017-05-14 ENCOUNTER — Institutional Professional Consult (permissible substitution): Payer: Medicare PPO | Admitting: Thoracic Surgery (Cardiothoracic Vascular Surgery)

## 2017-05-14 VITALS — BP 117/66 | HR 67 | Resp 16 | Ht 74.0 in | Wt 203.0 lb

## 2017-05-14 DIAGNOSIS — R0600 Dyspnea, unspecified: Secondary | ICD-10-CM

## 2017-05-14 DIAGNOSIS — I350 Nonrheumatic aortic (valve) stenosis: Secondary | ICD-10-CM

## 2017-05-14 NOTE — Patient Instructions (Addendum)
Stop taking losartan but continue to check your blood pressure at least once/day and whenever you feel dizzy.  Avoid driving an automobile

## 2017-05-14 NOTE — Progress Notes (Signed)
[facility]  MULTIDISCIPLINARY HEART VALVE CLINIC  CARDIOTHORACIC SURGERY CONSULTATION REPORT  Referring Provider is Nishan, Peter C, MD PCP is Aaron, Caren T, MD  Chief Complaint  Patient presents with  . 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  The patient states that he was first noted to have a heart murmur several years ago by his primary care physician.  He was referred to a cardiologist in [place] and was diagnosed with moderate aortic stenosis.   Echocardiogram performed at [facility] in [place], [place] on May 06, 2016 revealed severe aortic stenosis with normal left ventricular systolic function.  Peak velocity across the aortic valve was reported 3.9 m/s corresponding to mean transvalvular gradient estimated 36 mmHg and aortic valve area calculated 0.61 cm.  He was told that he might need to have surgical intervention eventually but medical follow-up was recommended.  Shortly after that the patient was self-referred to Dr. Nishan who happens to take care of a close friend of the patient's.  A nuclear stress test was performed that revealed resting ejection fraction calculated 51% with nuclear imaging felt to be low risk for myocardial ischemia.  His echocardiogram from [facility] was reviewed and close follow-up was recommended.  Recent follow-up echocardiogram performed April 23, 2017 revealed significant progression and severity of aortic stenosis.  Peak velocity across aortic valve measured 4.8 m/s corresponding to mean transvalvular gradient estimated 49  mmHg and DVI 0.21 with calculated aortic valve area 0.72 cm.  Left ventricular function remain normal.  The patient was subsequently referred for diagnostic cardiac catheterization which was performed April 30, 2017 by Dr. McAlhany.  Catheterization confirmed the presence of severe aortic stenosis with peak to peak and mean transvalvular gradients measured 49 and 42 mmHg, respectively.  There was moderate multivessel coronary artery disease with moderate stenosis of the proximal and mid left anterior descending coronary artery and severe stenosis in the distal left anterior descending coronary artery.  There was mild nonobstructive disease in the left circumflex and right coronary artery territories.  Cardiothoracic surgical consultation was requested.  The patient has been divorced for many years and lives alone.  He has no children.  He has 3 nephews, 1 of whom lives close by and is supportive.  He is accompanied by a daughter of a close friend for his office visit today.  He works at [employer].  He has been reasonably active physically and functionally independent all of his life.  He does report long-standing symptoms of exertional shortness of breath which he attributes to sarcoidosis.  He states that some exertional shortness of breath has gotten a bit worse.  Over the past several weeks he has developed worsening dizzy spells.  He has noticed that his blood pressure has been running low.  His dose of losartan has been progressively decreased by Dr. Nishan and his primary care physician.  He states that today he has measured his blood pressure several times.  At one time it was 110 systolic and another time it was in the 90s.  He has not had any syncopal events.  He denies any exertional chest tightness or chest pressure.  He denies any   history of resting shortness of breath, PND, orthopnea, or lower extremity edema.  He has a baseline tremor but walks without need for mechanical assistance.  He has some  problems with chronic low back pain.  He states that he feels weak.  Past Medical History:  Diagnosis Date  . Acute renal failure (HCC)   . Anxiety   . Arthritis    "left shoulder" (04/30/2017)  . B12 deficiency anemia   . Bilateral impacted cerumen   . Chronic lower back pain   . DDD (degenerative disc disease), lumbar   . DOE (dyspnea on exertion)   . Factitious dermatitis   . Heart murmur   . Hepatitis   . History of gout    "no problems w/it anymore; not on RX anymore" (04/30/2017)  . Hyperglycemia   . Hyperkalemia   . Hyperlipidemia   . Hypertension   . Joint pain in the shoulder/clavicle region   . Low back pain   . Occasional tremors   . Sarcoidosis   . Severe aortic stenosis   . Thrombocytopenia (HCC)     Past Surgical History:  Procedure Laterality Date  . CARDIAC CATHETERIZATION  04/30/2017  . HAMMER TOE SURGERY     "?toe; ?side"  . INGUINAL HERNIA REPAIR Right   . LAPAROSCOPIC CHOLECYSTECTOMY    . RIGHT/LEFT HEART CATH AND CORONARY ANGIOGRAPHY N/A 04/30/2017   Procedure: RIGHT/LEFT HEART CATH AND CORONARY ANGIOGRAPHY;  Surgeon: McAlhany, Christopher D, MD;  Location: [facility];  Service: Cardiovascular;  Laterality: N/A;  . SALIVARY STONE REMOVAL  1961   "spit gland had a stone on it"    Family History  Problem Relation Age of Onset  . Hypertension Mother   . CVA Father 70  . Breast cancer Sister     Social History   Socioeconomic History  . Marital status: Divorced    Spouse name: Not on file  . Number of children: Not on file  . Years of education: Not on file  . Highest education level: Not on file  Occupational History  . Occupation: [employer]  Social Needs  . Financial resource strain: Not on file  . Food insecurity:    Worry: Not on file    Inability: Not on file  . Transportation needs:    Medical: Not on file    Non-medical: Not on file  Tobacco Use  . Smoking status: Never Smoker  . Smokeless tobacco: Never Used  Substance  and Sexual Activity  . Alcohol use: No  . Drug use: Never  . Sexual activity: Not Currently  Lifestyle  . Physical activity:    Days per week: Not on file    Minutes per session: Not on file  . Stress: Not on file  Relationships  . Social connections:    Talks on phone: Not on file    Gets together: Not on file    Attends religious service: Not on file    Active member of club or organization: Not on file    Attends meetings of clubs or organizations: Not on file    Relationship status: Not on file  . Intimate partner violence:    Fear of current or ex partner: Not on file    Emotionally abused: Not on file    Physically abused: Not on file    Forced sexual activity: Not on file  Other Topics Concern  . Not on file  Social History Narrative  . Not on file      Current Outpatient Medications  Medication Sig Dispense Refill  . aspirin EC 81 MG tablet Take 1 tablet (81 mg total) by mouth daily. 90 tablet 3  . clonazePAM (KLONOPIN) 0.5 MG tablet Take 0.5 mg by mouth 2 (two) times daily as needed for anxiety.    . losartan (COZAAR) 25 MG tablet Take 25 mg by mouth daily.    . simvastatin (ZOCOR) 40 MG tablet Take 40 mg by mouth daily.    . traZODone (DESYREL) 50 MG tablet Take 100 mg by mouth at bedtime.      No current facility-administered medications for this visit.     No Known Allergies    Review of Systems:   General:  normal appetite, decreased energy, no weight gain, no weight loss, no fever  Cardiac:  no chest pain with exertion, no chest pain at rest, + SOB with exertion, no resting SOB, no PND, no orthopnea, no palpitations, no arrhythmia, no atrial fibrillation, no LE edema, + dizzy spells, no syncope  Respiratory:  + exertional shortness of breath, no home oxygen, no productive cough, + intermittent dry cough, no bronchitis, no wheezing, no hemoptysis, no asthma, no pain with inspiration or cough, no sleep apnea, no CPAP at night  GI:   no difficulty swallowing,  no reflux, no frequent heartburn, no hiatal hernia, no abdominal pain, no constipation, no diarrhea, no hematochezia, no hematemesis, no melena  GU:   no dysuria,  no frequency, no urinary tract infection, no hematuria, no enlarged prostate, no kidney stones, + kidney disease  Vascular:  no pain suggestive of claudication, no pain in feet, no leg cramps, no varicose veins, no DVT, no non-healing foot ulcer  Neuro:   no stroke, no TIA's, no seizures, no headaches, no temporary blindness one eye,  no slurred speech, no peripheral neuropathy, + chronic pain, mild instability of gait, no memory/cognitive dysfunction  Musculoskeletal: + arthritis, no joint swelling, no myalgias, minor difficulty walking, normal mobility   Skin:   no rash, no itching, no skin infections, no pressure sores or ulcerations  Psych:   + anxiety, no depression, + nervousness, + unusual recent stress  Eyes:   no blurry vision, no floaters, no recent vision changes, no wears glasses or contacts  ENT:   no hearing loss, no loose or painful teeth, no dentures, last saw dentist 6 months ago  Hematologic:  no easy bruising, no abnormal bleeding, no clotting disorder, no frequent epistaxis  Endocrine:  no diabetes, does not check CBG's at home           Physical Exam:   BP 117/66 (BP Location: Right Arm, Patient Position: Sitting, Cuff Size: Normal)   Pulse 67   Resp 16   Ht 6' 2" (1.88 m)   Wt 203 lb (92.1 kg)   SpO2 98% Comment: ON RA  BMI 26.06 kg/m   General:  Thin, somewhat frail but otherwise  well-appearing  HEENT:  Unremarkable   Neck:   no JVD, no bruits, no adenopathy   Chest:   clear to auscultation, symmetrical breath sounds, no wheezes, no rhonchi   CV:   RRR, grade III/VI crescendo/decrescendo murmur heard best at LSB,  no diastolic murmur  Abdomen:  soft, non-tender, no masses   Extremities:  warm, well-perfused, pulses palpable, no LE edema  Rectal/GU  Deferred  Neuro:   Grossly non-focal and  symmetrical throughout  Skin:   Clean and dry, no rashes, no breakdown   Diagnostic Tests:  Transthoracic Echocardiography    Patient:    Trzcinski, Hutch MR #:       [id number] Study Date: 04/23/2017 Gender:     M Age:        72 Height:     188 cm Weight:     92.1 kg BSA:        2.2 m^2 Pt. Status: Room:   ORDERING     Peter Nishan, M.D.  REFERRING    Peter Nishan, M.D.  ATTENDING    Traci Turner, MD  SONOGRAPHER  Billy Chung, RDCS  REFERRING    Aaron, Caren T  PERFORMING   [facility], Outpatient  cc:  ------------------------------------------------------------------- LV EF: 60% -   65%  ------------------------------------------------------------------- Indications:      Aortic stenosis (I35).  ------------------------------------------------------------------- History:   PMH:   Dyspnea.  Aortic valve disease.  Risk factors: Sarcoidosis. Hypertension. Dyslipidemia.  ------------------------------------------------------------------- Study Conclusions  - Left ventricle: The cavity size was normal. Wall thickness was   increased in a pattern of mild LVH. Systolic function was normal.   The estimated ejection fraction was in the range of 60% to 65%.   Doppler parameters are consistent with abnormal left ventricular   relaxation (grade 1 diastolic dysfunction). - Aortic valve: There was critical stenosis. There was trivial   regurgitation. Mean gradient (S): 49 mm Hg. Peak gradient (S): 90   mm Hg. - Mitral valve: Calcified annulus. - Left atrium: The atrium was moderately dilated. - Atrial septum: No defect or patent foramen ovale was identified.  ------------------------------------------------------------------- Labs, prior tests, procedures, and surgery: Transthoracic echocardiography (05/06/2016).     EF was 60%. Aortic valve: peak gradient of 64 mm Hg and mean gradient of 39 mm Hg.  ------------------------------------------------------------------- Study  data:  The previous study was not available, so comparison was made to the report of 05/06/2016.  Study status:  Routine. Procedure:  The patient reported no pain pre or post test. Transthoracic echocardiography. Image quality was adequate.  Study completion:  There were no complications.          Transthoracic echocardiography.  M-mode, complete 2D, 3D, spectral Doppler, and color Doppler.  Birthdate:  Patient birthdate: 08/20/1945.  Age: Patient is 72 yr old.  Sex:  Gender: male.    BMI: 26.1 kg/m^2. Blood pressure:     102/60  Patient status:  Outpatient.  Study date:  Study date: 04/23/2017. Study time: 02:16 PM.  Location: [facility]  -------------------------------------------------------------------  ------------------------------------------------------------------- Left ventricle:  The cavity size was normal. Wall thickness was increased in a pattern of mild LVH. Systolic function was normal. The estimated ejection fraction was in the range of 60% to 65%. Doppler parameters are consistent with abnormal left ventricular relaxation (grade 1 diastolic dysfunction).  ------------------------------------------------------------------- Aortic valve:   Probably trileaflet; severely thickened, severely calcified leaflets.  Doppler:   There was critical stenosis. There was trivial regurgitation.    VTI ratio of LVOT to aortic valve: 0.25. Valve area (VTI): 0.86 cm^2. Indexed valve area (VTI): 0.39 cm^2/m^2. Peak velocity ratio of LVOT to aortic valve: 0.21. Valve area (Vmax): 0.72 cm^2. Indexed valve area (Vmax): 0.33 cm^2/m^2. Mean velocity ratio of LVOT to aortic valve: 0.2. Valve area (Vmean): 0.71 cm^2. Indexed valve area (Vmean): 0.32 cm^2/m^2.    Mean gradient (S): 49 mm Hg. Peak gradient (S): 90 mm Hg.   ------------------------------------------------------------------- Mitral valve:   Calcified annulus.  Doppler:  There was trivial regurgitation.    Valve area by  pressure half-time: 1.96 cm^2. Indexed valve area by   pressure half-time: 0.89 cm^2/m^2.    Peak gradient (D): 2 mm Hg.  ------------------------------------------------------------------- Left atrium:  The atrium was moderately dilated.  ------------------------------------------------------------------- Atrial septum:  No defect or patent foramen ovale was identified.   ------------------------------------------------------------------- Right ventricle:  The cavity size was normal. Wall thickness was normal. Systolic function was normal.  ------------------------------------------------------------------- Pulmonic valve:    Doppler:  There was mild regurgitation.  ------------------------------------------------------------------- Tricuspid valve:   Doppler:  There was mild regurgitation.  ------------------------------------------------------------------- Right atrium:  The atrium was normal in size.  ------------------------------------------------------------------- Pericardium:  The pericardium was normal in appearance.  ------------------------------------------------------------------- Systemic veins: Inferior vena cava: The vessel was normal in size. The respirophasic diameter changes were in the normal range (>= 50%), consistent with normal central venous pressure.  ------------------------------------------------------------------- Measurements   Left ventricle                            Value          Reference  LV ID, ED, PLAX chordal           (L)     42    mm       43 - 52  LV ID, ES, PLAX chordal                   27    mm       23 - 38  LV fx shortening, PLAX chordal            36    %        >=29  LV PW thickness, ED                       13    mm       ---------  IVS/LV PW ratio, ED                       1              <=1.3  Stroke volume, 2D                         85    ml       ---------  Stroke volume/bsa, 2D                     39    ml/m^2    ---------  LV e&', lateral                            7.4   cm/s     ---------  LV E/e&', lateral                          9.96           ---------  LV e&', medial                             5.66  cm/s     ---------  LV E/e&', medial                           13.02          ---------  LV e&', average                              6.53  cm/s     ---------  LV E/e&', average                          11.29          ---------    Ventricular septum                        Value          Reference  IVS thickness, ED                         13    mm       ---------    LVOT                                      Value          Reference  LVOT ID, S                                21    mm       ---------  LVOT area                                 3.46  cm^2     ---------  LVOT peak velocity, S                     99    cm/s     ---------  LVOT mean velocity, S                     67.2  cm/s     ---------  LVOT VTI, S                               24.5  cm       ---------    Aortic valve                              Value          Reference  Aortic valve peak velocity, S             475   cm/s     ---------  Aortic valve mean velocity, S             329   cm/s     ---------  Aortic valve VTI, S                       98.3  cm       ---------  Aortic mean gradient, S                   49    mm Hg    ---------  Aortic peak gradient, S                   90    mm Hg    ---------  VTI ratio, LVOT/AV                          0.25           ---------  Aortic valve area, VTI                    0.86  cm^2     ---------  Aortic valve area/bsa, VTI                0.39  cm^2/m^2 ---------  Velocity ratio, peak, LVOT/AV             0.21           ---------  Aortic valve area, peak velocity          0.72  cm^2     ---------  Aortic valve area/bsa, peak               0.33  cm^2/m^2 ---------  velocity  Velocity ratio, mean, LVOT/AV             0.2            ---------  Aortic valve area, mean velocity          0.71   cm^2     ---------  Aortic valve area/bsa, mean               0.32  cm^2/m^2 ---------  velocity    Aorta                                     Value          Reference  Aortic root ID, ED                        38    mm       ---------  Ascending aorta ID, A-P, S                36    mm       ---------    Left atrium                               Value          Reference  LA ID, A-P, ES                            46    mm       ---------  LA ID/bsa, A-P                            2.09  cm/m^2   <=2.2  LA volume, S                              77.6  ml       ---------  LA volume/bsa, S                          35.2  ml/m^2   ---------  LA volume, ES, 1-p A4C                    74.3  ml       ---------  LA volume/bsa, ES, 1-p A4C                  33.7  ml/m^2   ---------  LA volume, ES, 1-p A2C                    75.4  ml       ---------  LA volume/bsa, ES, 1-p A2C                34.2  ml/m^2   ---------    Mitral valve                              Value          Reference  Mitral E-wave peak velocity               73.7  cm/s     ---------  Mitral A-wave peak velocity               106   cm/s     ---------  Mitral deceleration time          (H)     384   ms       150 - 230  Mitral pressure half-time                 112   ms       ---------  Mitral peak gradient, D                   2     mm Hg    ---------  Mitral E/A ratio, peak                    0.7            ---------  Mitral valve area, PHT, DP                1.96  cm^2     ---------  Mitral valve area/bsa, PHT, DP            0.89  cm^2/m^2 ---------    Systemic veins                            Value          Reference  Estimated CVP                             3     mm Hg    ---------    Right ventricle                           Value          Reference  RV ID, minor axis, ED, A4C base           42    mm       ---------  TAPSE                                     17.3  mm       ---------  RV s&', lateral, S                          13.6  cm/s     ---------  Legend: (L)  and  (  H)  mark values outside specified reference range.  ------------------------------------------------------------------- Prepared and Electronically Authenticated by  Peter Nishan, M.D. 2019-04-17T15:06:58   RIGHT/LEFT HEART CATH AND CORONARY ANGIOGRAPHY  Conclusion     Ost RCA to Prox RCA lesion is 30% stenosed.  Prox RCA to Mid RCA lesion is 20% stenosed.  Ost RPDA lesion is 30% stenosed.  Prox Cx lesion is 60% stenosed.  Dist LAD lesion is 80% stenosed.  Prox LAD lesion is 50% stenosed.  Prox LAD to Mid LAD lesion is 30% stenosed.  Ost 1st Diag lesion is 60% stenosed.  There is severe aortic valve stenosis.  Ost 2nd Mrg lesion is 30% stenosed.  Mid Cx lesion is 30% stenosed.   1. The Left main has no obstructive disease 2. The LAD is a large caliber vessel in the proximal and mid vessel and then becomes smaller in caliber in the distal vessel. The mid LAD has a moderate stenosis that does not appear to be flow limiting. The distal LAD is smaller in caliber. There is a focal severe stenosis in the distal LAD. This vessel appears to be too small for stenting.  3. The Circumflex is a large caliber vessel with a moderate proximal stenosis just before the takeoff of a large obtuse marginal branch. The large caliber first obtuse marginal branch is free of obstructive disease.  4. The RCA is a large, dominant vessel with mild ostial stenosis, mild disease in the mid and distal vessel. There is mild disease in the moderate caliber PDA.  5. Severe aortic stenosis (mean gradient 42.1 mmHg, peak to peak gradient 49 mmHg, AVA 1.2 cm2).   Recommendations: I think his CAD can be managed at this time with medical therapy. He is having no angina. The LAD lesion is distal and the vessel is small in caliber (not favorable for stenting) and this only supplies the apical segment of myocardium. The Circumflex lesion is moderate. Will proceed  with workup for AVR vs TAVR. He has an appointment with Dr. Owen next week. I suspect that he will be a good TAVR candidate.    Indications   Severe aortic stenosis [I35.0 (ICD-10-CM)]  Procedural Details/Technique   Technical Details Indication: 72 yo male with severe AS, workup for AVR vs TAVR.   Procedure: The risks, benefits, complications, treatment options, and expected outcomes were discussed with the patient. The patient and/or family concurred with the proposed plan, giving informed consent. The patient was brought to the cath lab after IV hydration was given. The patient was further sedated with Versed and Fentanyl. There was an IV catheter present in the right antecubital vein. This area was prepped and draped. I then changed out this IV catheter for a 5 French sheath. Right heart cath performed with a balloon tipped catheter. The right wrist was prepped and draped in a sterile fashion. 1% lidocaine was used for local anesthesia. Using the modified Seldinger access technique, a 5 French sheath was placed in the right radial artery. 3 mg Verapamil was given through the sheath. 4500 units IV heparin was given. Standard diagnostic catheters were used to perform selective coronary angiography. I crossed the aortic valve with an AL-1 catheter and a straight wire. LV pressures measured. The sheath was removed from the right radial artery and a Terumo hemostasis band was applied at the arteriotomy site on the right wrist.     Estimated blood loss <50 mL.  During this procedure the patient was administered the following to achieve and maintain   moderate conscious sedation: Versed 1 mg, Fentanyl 25 mcg, while the patient's heart rate, blood pressure, and oxygen saturation were continuously monitored. The period of conscious sedation was 32 minutes, of which I was present face-to-face 100% of this time.  Complications   Complications documented before study signed (04/30/2017 9:40 AM EDT)      RIGHT/LEFT HEART CATH AND CORONARY ANGIOGRAPHY   None Documented by McAlhany, Christopher D, MD 04/30/2017 9:18 AM EDT  Time Range: Intraprocedure      Coronary Findings   Diagnostic  Dominance: Right  Left Anterior Descending  Vessel is large.  Prox LAD lesion 50% stenosed  Prox LAD lesion is 50% stenosed.  Prox LAD to Mid LAD lesion 30% stenosed  Prox LAD to Mid LAD lesion is 30% stenosed.  Dist LAD lesion 80% stenosed  Dist LAD lesion is 80% stenosed.  First Diagonal Branch  Vessel is moderate in size.  Ost 1st Diag lesion 60% stenosed  Ost 1st Diag lesion is 60% stenosed.  Left Circumflex  Vessel is moderate in size.  Prox Cx lesion 60% stenosed  Prox Cx lesion is 60% stenosed.  Mid Cx lesion 30% stenosed  Mid Cx lesion is 30% stenosed.  First Obtuse Marginal Branch  Vessel is small in size.  Second Obtuse Marginal Branch  Vessel is moderate in size.  Ost 2nd Mrg lesion 30% stenosed  Ost 2nd Mrg lesion is 30% stenosed.  Right Coronary Artery  Vessel is large.  Ost RCA to Prox RCA lesion 30% stenosed  Ost RCA to Prox RCA lesion is 30% stenosed.  Prox RCA to Mid RCA lesion 20% stenosed  Prox RCA to Mid RCA lesion is 20% stenosed.  Right Posterior Descending Artery  Vessel is moderate in size.  Ost RPDA lesion 30% stenosed  Ost RPDA lesion is 30% stenosed.  Right Posterior Atrioventricular Branch  Vessel is moderate in size.  First Right Posterolateral  Vessel is moderate in size.  Intervention   No interventions have been documented.  Left Heart   Aortic Valve There is severe aortic valve stenosis.  Coronary Diagrams   Diagnostic Diagram       Implants    No implant documentation for this case.  MERGE Images   Show images for CARDIAC CATHETERIZATION   Link to Procedure Log   Procedure Log    Hemo Data    Most Recent Value  Fick Cardiac Output 7.01 L/min  Fick Cardiac Output Index 3.19 (L/min)/BSA  Aortic Mean Gradient 42.1 mmHg  Aortic  Peak Gradient 49 mmHg  Aortic Valve Area 1.20  Aortic Value Area Index 0.55 cm2/BSA  RA A Wave 6 mmHg  RA V Wave 5 mmHg  RA Mean 3 mmHg  RV Systolic Pressure 28 mmHg  RV Diastolic Pressure 0 mmHg  RV EDP 5 mmHg  PA Systolic Pressure 28 mmHg  PA Diastolic Pressure 10 mmHg  PA Mean 17 mmHg  PW A Wave 8 mmHg  PW V Wave 8 mmHg  PW Mean 6 mmHg  AO Systolic Pressure 121 mmHg  AO Diastolic Pressure 64 mmHg  AO Mean 87 mmHg  LV Systolic Pressure 174 mmHg  LV Diastolic Pressure 5 mmHg  LV EDP 16 mmHg  Arterial Occlusion Pressure Extended Systolic Pressure 125 mmHg  Arterial Occlusion Pressure Extended Diastolic Pressure 62 mmHg  Arterial Occlusion Pressure Extended Mean Pressure 88 mmHg  Left Ventricular Apex Extended Systolic Pressure 174 mmHg  Left Ventricular Apex Extended Diastolic Pressure 5 mmHg  Left Ventricular Apex   Extended EDP Pressure 17 mmHg  QP/QS 1  TPVR Index 5.34 HRUI  TSVR Index 27.29 HRUI  PVR SVR Ratio 0.13  TPVR/TSVR Ratio 0.2     STS Risk Calculator  Procedure: AVR + CAB CALCULATE   Risk of Mortality:  2.274% Renal Failure:  3.567% Permanent Stroke:  1.396% Prolonged Ventilation:  8.059% DSW Infection:  0.177% Reoperation:  4.860% Morbidity or Mortality:  12.722% Short Length of Stay:  31.229% Long Length of Stay:  7.228%      Impression:  Patient has stage D severe symptomatic aortic stenosis.  He describes a long history of symptoms of exertional shortness of breath and fatigue which have progressed somewhat recently, consistent with chronic diastolic congestive heart failure, [place] Heart Association functional class II.  Recently he has also been experiencing frequent dizzy spells and his blood pressure has been running much lower, causing him to gradually decrease his dose of losartan.  He has not had any syncopal episodes.  He denies any exertional chest pain or chest tightness suspicious for angina pectoris.  I have personally  reviewed the patient's recent transthoracic echocardiogram and diagnostic cardiac catheterization.  Echocardiogram confirmed the presence of severe aortic stenosis.  Peak velocity across aortic valve measured 4.8 m/s corresponding to mean transvalvular gradient estimated 49 mmHg.  The aortic valve is trileaflet with severe thickening, calcification, and restricted leaflet mobility involving all 3 leaflets.  Left ventricular systolic function remains normal.  Diagnostic cardiac catheterization confirmed the presence of severe aortic stenosis and revealed moderate coronary artery disease.  There is moderate long segment 50% stenosis of the proximal and mid left anterior descending coronary artery as well as proximal stenosis of the diagonal branch.  There is high-grade stenosis of the distal left anterior descending coronary artery close to the apex.  There is no question the patient needs aortic valve replacement.  I suspect that risks associated with conventional surgical aortic valve replacement with or without coronary artery bypass grafting would be moderately elevated because of the patient's age and comorbid medical conditions.  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  It might be reasonable to consider transcatheter aortic valve replacement as an alternative to conventional surgery with continued medical therapy for treatment of the patient's coronary artery disease, presuming that the patient has anatomical characteristics suitable for transcatheter aortic valve replacement with relatively low risk.   Plan:  The patient was counseled at length regarding treatment alternatives for management of severe symptomatic aortic stenosis and moderate coronary artery disease.  Alternative approaches such as conventional aortic valve replacement with or without coronary  artery bypass grafting, transcatheter aortic valve replacement, and continued medical therapy without intervention were compared and contrasted at length.  The risks associated with conventional surgical aortic valve replacement were discussed in detail, as were expectations for post-operative convalescence.  Issues specific to transcatheter aortic valve replacement were discussed including questions about long term valve durability, the potential for paravalvular leak, possible increased risk of need for permanent pacemaker placement, and other technical complications related to the procedure itself.  Long-term prognosis with medical therapy was discussed. This discussion was placed in the context of the patient's own specific clinical presentation and past medical history.  All of their questions have been addressed.  The patient hopes to proceed with transcatheter aortic valve replacement as soon as possible.  As a next step the   patient will need to undergo CT angiography to evaluate the feasibility of transcatheter aortic valve replacement.  This will also allow us to further estimate risks associated with conventional surgery due to in-depth evaluation of the aortic root and ascending thoracic aorta.  The patient will undergo formal pulmonary function testing to assess the severity of his underlying chronic lung disease.  Patient will be referred for a second surgical opinion and hopes to proceed with transcatheter aortic valve replacement on Jun 03, 2017 if possible.  During the interim I have instructed the patient to stop taking his losartan but to continue to measure his blood pressure on a daily basis.  He will avoid strenuous activity and notify us if he continues to have significant dizzy spells.  He has also been instructed to avoid driving an automobile.   I spent in excess of 90 minutes during the conduct of this office consultation and >50% of this time involved direct face-to-face encounter  with the patient for counseling and/or coordination of their care.     Jason H. Owen, MD 05/14/2017 2:34 PM   

## 2017-05-14 NOTE — H&P (View-Only) (Signed)
[facility]  MULTIDISCIPLINARY HEART VALVE CLINIC  CARDIOTHORACIC SURGERY CONSULTATION REPORT  Referring Provider is Wendall Stade, MD PCP is Bedelia Person, MD  Chief Complaint  Patient presents with  . 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  The patient states that he was first noted to have a heart murmur several years ago by his primary care physician.  He was referred to a cardiologist in [place] and was diagnosed with moderate aortic stenosis.   Echocardiogram performed at [facility] in [place], [place] on May 06, 2016 revealed severe aortic stenosis with normal left ventricular systolic function.  Peak velocity across the aortic valve was reported 3.9 m/s corresponding to mean transvalvular gradient estimated 36 mmHg and aortic valve area calculated 0.61 cm.  He was told that he might need to have surgical intervention eventually but medical follow-up was recommended.  Shortly after that the patient was self-referred to Dr. Eden Emms who happens to take care of a close friend of the patient's.  A nuclear stress test was performed that revealed resting ejection fraction calculated 51% with nuclear imaging felt to be low risk for myocardial ischemia.  His echocardiogram from [facility] was reviewed and close follow-up was recommended.  Recent follow-up echocardiogram performed April 23, 2017 revealed significant progression and severity of aortic stenosis.  Peak velocity across aortic valve measured 4.8 m/s corresponding to mean transvalvular gradient estimated 49  mmHg and DVI 0.21 with calculated aortic valve area 0.72 cm.  Left ventricular function remain normal.  The patient was subsequently referred for diagnostic cardiac catheterization which was performed April 30, 2017 by Dr. Clifton Yuki.  Catheterization confirmed the presence of severe aortic stenosis with peak to peak and mean transvalvular gradients measured 49 and 42 mmHg, respectively.  There was moderate multivessel coronary artery disease with moderate stenosis of the proximal and mid left anterior descending coronary artery and severe stenosis in the distal left anterior descending coronary artery.  There was mild nonobstructive disease in the left circumflex and right coronary artery territories.  Cardiothoracic surgical consultation was requested.  The patient has been divorced for many years and lives alone.  He has no children.  He has 3 nephews, 1 of whom lives close by and is supportive.  He is accompanied by a daughter of a close friend for his office visit today.  He works at [employer].  He has been reasonably active physically and functionally independent all of his life.  He does report long-standing symptoms of exertional shortness of breath which he attributes to sarcoidosis.  He states that some exertional shortness of breath has gotten a bit worse.  Over the past several weeks he has developed worsening dizzy spells.  He has noticed that his blood pressure has been running low.  His dose of losartan has been progressively decreased by Dr. Eden Emms and his primary care physician.  He states that today he has measured his blood pressure several times.  At one time it was 110 systolic and another time it was in the 90s.  He has not had any syncopal events.  He denies any exertional chest tightness or chest pressure.  He denies any  history of resting shortness of breath, PND, orthopnea, or lower extremity edema.  He has a baseline tremor but walks without need for mechanical assistance.  He has some  problems with chronic low back pain.  He states that he feels weak.  Past Medical History:  Diagnosis Date  . Acute renal failure (HCC)   . Anxiety   . Arthritis    "left shoulder" (04/30/2017)  . B12 deficiency anemia   . Bilateral impacted cerumen   . Chronic lower back pain   . DDD (degenerative disc disease), lumbar   . DOE (dyspnea on exertion)   . Factitious dermatitis   . Heart murmur   . Hepatitis   . History of gout    "no problems w/it anymore; not on RX anymore" (04/30/2017)  . Hyperglycemia   . Hyperkalemia   . Hyperlipidemia   . Hypertension   . Joint pain in the shoulder/clavicle region   . Low back pain   . Occasional tremors   . Sarcoidosis   . Severe aortic stenosis   . Thrombocytopenia (HCC)     Past Surgical History:  Procedure Laterality Date  . CARDIAC CATHETERIZATION  04/30/2017  . HAMMER TOE SURGERY     "?toe; ?side"  . INGUINAL HERNIA REPAIR Right   . LAPAROSCOPIC CHOLECYSTECTOMY    . RIGHT/LEFT HEART CATH AND CORONARY ANGIOGRAPHY N/A 04/30/2017   Procedure: RIGHT/LEFT HEART CATH AND CORONARY ANGIOGRAPHY;  Surgeon: Kathleene Hazel, MD;  Location: [facility] INVASIVE CV LAB;  Service: Cardiovascular;  Laterality: N/A;  . SALIVARY STONE REMOVAL  1961   "spit gland had a stone on it"    Family History  Problem Relation Age of Onset  . Hypertension Mother   . CVA Father 21  . Breast cancer Sister     Social History   Socioeconomic History  . Marital status: Divorced    Spouse name: Not on file  . Number of children: Not on file  . Years of education: Not on file  . Highest education level: Not on file  Occupational History  . Occupation: [employer]  Social Needs  . Financial resource strain: Not on file  . Food insecurity:    Worry: Not on file    Inability: Not on file  . Transportation needs:    Medical: Not on file    Non-medical: Not on file  Tobacco Use  . Smoking status: Never Smoker  . Smokeless tobacco: Never Used  Substance  and Sexual Activity  . Alcohol use: No  . Drug use: Never  . Sexual activity: Not Currently  Lifestyle  . Physical activity:    Days per week: Not on file    Minutes per session: Not on file  . Stress: Not on file  Relationships  . Social connections:    Talks on phone: Not on file    Gets together: Not on file    Attends religious service: Not on file    Active member of club or organization: Not on file    Attends meetings of clubs or organizations: Not on file    Relationship status: Not on file  . Intimate partner violence:    Fear of current or ex partner: Not on file    Emotionally abused: Not on file    Physically abused: Not on file    Forced sexual activity: Not on file  Other Topics Concern  . Not on file  Social History Narrative  . Not on file  Current Outpatient Medications  Medication Sig Dispense Refill  . aspirin EC 81 MG tablet Take 1 tablet (81 mg total) by mouth daily. 90 tablet 3  . clonazePAM (KLONOPIN) 0.5 MG tablet Take 0.5 mg by mouth 2 (two) times daily as needed for anxiety.    Marland Kitchen losartan (COZAAR) 25 MG tablet Take 25 mg by mouth daily.    . simvastatin (ZOCOR) 40 MG tablet Take 40 mg by mouth daily.    . traZODone (DESYREL) 50 MG tablet Take 100 mg by mouth at bedtime.      No current facility-administered medications for this visit.     No Known Allergies    Review of Systems:   General:  normal appetite, decreased energy, no weight gain, no weight loss, no fever  Cardiac:  no chest pain with exertion, no chest pain at rest, + SOB with exertion, no resting SOB, no PND, no orthopnea, no palpitations, no arrhythmia, no atrial fibrillation, no LE edema, + dizzy spells, no syncope  Respiratory:  + exertional shortness of breath, no home oxygen, no productive cough, + intermittent dry cough, no bronchitis, no wheezing, no hemoptysis, no asthma, no pain with inspiration or cough, no sleep apnea, no CPAP at night  GI:   no difficulty swallowing,  no reflux, no frequent heartburn, no hiatal hernia, no abdominal pain, no constipation, no diarrhea, no hematochezia, no hematemesis, no melena  GU:   no dysuria,  no frequency, no urinary tract infection, no hematuria, no enlarged prostate, no kidney stones, + kidney disease  Vascular:  no pain suggestive of claudication, no pain in feet, no leg cramps, no varicose veins, no DVT, no non-healing foot ulcer  Neuro:   no stroke, no TIA's, no seizures, no headaches, no temporary blindness one eye,  no slurred speech, no peripheral neuropathy, + chronic pain, mild instability of gait, no memory/cognitive dysfunction  Musculoskeletal: + arthritis, no joint swelling, no myalgias, minor difficulty walking, normal mobility   Skin:   no rash, no itching, no skin infections, no pressure sores or ulcerations  Psych:   + anxiety, no depression, + nervousness, + unusual recent stress  Eyes:   no blurry vision, no floaters, no recent vision changes, no wears glasses or contacts  ENT:   no hearing loss, no loose or painful teeth, no dentures, last saw dentist 6 months ago  Hematologic:  no easy bruising, no abnormal bleeding, no clotting disorder, no frequent epistaxis  Endocrine:  no diabetes, does not check CBG's at home           Physical Exam:   BP 117/66 (BP Location: Right Arm, Patient Position: Sitting, Cuff Size: Normal)   Pulse 67   Resp 16   Ht 6\' 2"  (1.88 m)   Wt 203 lb (92.1 kg)   SpO2 98% Comment: ON RA  BMI 26.06 kg/m   General:  Thin, somewhat frail but otherwise  well-appearing  HEENT:  Unremarkable   Neck:   no JVD, no bruits, no adenopathy   Chest:   clear to auscultation, symmetrical breath sounds, no wheezes, no rhonchi   CV:   RRR, grade III/VI crescendo/decrescendo murmur heard best at LSB,  no diastolic murmur  Abdomen:  soft, non-tender, no masses   Extremities:  warm, well-perfused, pulses palpable, no LE edema  Rectal/GU  Deferred  Neuro:   Grossly non-focal and  symmetrical throughout  Skin:   Clean and dry, no rashes, no breakdown   Diagnostic Tests:  Transthoracic Echocardiography  Patient:    Jason Anthony, Jason Anthony MR #:       [id number] Study Date: 04/23/2017 Gender:     M Age:        63 Height:     188 cm Weight:     92.1 kg BSA:        2.2 m^2 Pt. Status: Room:   Layla Maw, M.D.  REFERRING    Charlton Haws, M.D.  ATTENDING    Armanda Magic, MD  SONOGRAPHER  Dewitt Hoes, RDCS  REFERRING    Clifton Custard, Caren T  PERFORMING   [facility], Outpatient  cc:  ------------------------------------------------------------------- LV EF: 60% -   65%  ------------------------------------------------------------------- Indications:      Aortic stenosis (I35).  ------------------------------------------------------------------- History:   PMH:   Dyspnea.  Aortic valve disease.  Risk factors: Sarcoidosis. Hypertension. Dyslipidemia.  ------------------------------------------------------------------- Study Conclusions  - Left ventricle: The cavity size was normal. Wall thickness was   increased in a pattern of mild LVH. Systolic function was normal.   The estimated ejection fraction was in the range of 60% to 65%.   Doppler parameters are consistent with abnormal left ventricular   relaxation (grade 1 diastolic dysfunction). - Aortic valve: There was critical stenosis. There was trivial   regurgitation. Mean gradient (S): 49 mm Hg. Peak gradient (S): 90   mm Hg. - Mitral valve: Calcified annulus. - Left atrium: The atrium was moderately dilated. - Atrial septum: No defect or patent foramen ovale was identified.  ------------------------------------------------------------------- Labs, prior tests, procedures, and surgery: Transthoracic echocardiography (05/06/2016).     EF was 60%. Aortic valve: peak gradient of 64 mm Hg and mean gradient of 39 mm Hg.  ------------------------------------------------------------------- Study  data:  The previous study was not available, so comparison was made to the report of 05/06/2016.  Study status:  Routine. Procedure:  The patient reported no pain pre or post test. Transthoracic echocardiography. Image quality was adequate.  Study completion:  There were no complications.          Transthoracic echocardiography.  M-mode, complete 2D, 3D, spectral Doppler, and color Doppler.  Birthdate:  Patient birthdate: [birth year].  Age: Patient is 72 yr old.  Sex:  Gender: male.    BMI: 26.1 kg/m^2. Blood pressure:     102/60  Patient status:  Outpatient.  Study date:  Study date: 04/23/2017. Study time: 02:16 PM.  Location: [facility]  -------------------------------------------------------------------  ------------------------------------------------------------------- Left ventricle:  The cavity size was normal. Wall thickness was increased in a pattern of mild LVH. Systolic function was normal. The estimated ejection fraction was in the range of 60% to 65%. Doppler parameters are consistent with abnormal left ventricular relaxation (grade 1 diastolic dysfunction).  ------------------------------------------------------------------- Aortic valve:   Probably trileaflet; severely thickened, severely calcified leaflets.  Doppler:   There was critical stenosis. There was trivial regurgitation.    VTI ratio of LVOT to aortic valve: 0.25. Valve area (VTI): 0.86 cm^2. Indexed valve area (VTI): 0.39 cm^2/m^2. Peak velocity ratio of LVOT to aortic valve: 0.21. Valve area (Vmax): 0.72 cm^2. Indexed valve area (Vmax): 0.33 cm^2/m^2. Mean velocity ratio of LVOT to aortic valve: 0.2. Valve area (Vmean): 0.71 cm^2. Indexed valve area (Vmean): 0.32 cm^2/m^2.    Mean gradient (S): 49 mm Hg. Peak gradient (S): 90 mm Hg.   ------------------------------------------------------------------- Mitral valve:   Calcified annulus.  Doppler:  There was trivial regurgitation.    Valve area by  pressure half-time: 1.96 cm^2. Indexed valve area by  pressure half-time: 0.89 cm^2/m^2.    Peak gradient (D): 2 mm Hg.  ------------------------------------------------------------------- Left atrium:  The atrium was moderately dilated.  ------------------------------------------------------------------- Atrial septum:  No defect or patent foramen ovale was identified.   ------------------------------------------------------------------- Right ventricle:  The cavity size was normal. Wall thickness was normal. Systolic function was normal.  ------------------------------------------------------------------- Pulmonic valve:    Doppler:  There was mild regurgitation.  ------------------------------------------------------------------- Tricuspid valve:   Doppler:  There was mild regurgitation.  ------------------------------------------------------------------- Right atrium:  The atrium was normal in size.  ------------------------------------------------------------------- Pericardium:  The pericardium was normal in appearance.  ------------------------------------------------------------------- Systemic veins: Inferior vena cava: The vessel was normal in size. The respirophasic diameter changes were in the normal range (>= 50%), consistent with normal central venous pressure.  ------------------------------------------------------------------- Measurements   Left ventricle                            Value          Reference  LV ID, ED, PLAX chordal           (L)     42    mm       43 - 52  LV ID, ES, PLAX chordal                   27    mm       23 - 38  LV fx shortening, PLAX chordal            36    %        >=29  LV PW thickness, ED                       13    mm       ---------  IVS/LV PW ratio, ED                       1              <=1.3  Stroke volume, 2D                         85    ml       ---------  Stroke volume/bsa, 2D                     39    ml/m^2    ---------  LV e&', lateral                            7.4   cm/s     ---------  LV E/e&', lateral                          9.96           ---------  LV e&', medial                             5.66  cm/s     ---------  LV E/e&', medial                           13.02          ---------  LV e&', average  6.53  cm/s     ---------  LV E/e&', average                          11.29          ---------    Ventricular septum                        Value          Reference  IVS thickness, ED                         13    mm       ---------    LVOT                                      Value          Reference  LVOT ID, S                                21    mm       ---------  LVOT area                                 3.46  cm^2     ---------  LVOT peak velocity, S                     99    cm/s     ---------  LVOT mean velocity, S                     67.2  cm/s     ---------  LVOT VTI, S                               24.5  cm       ---------    Aortic valve                              Value          Reference  Aortic valve peak velocity, S             475   cm/s     ---------  Aortic valve mean velocity, S             329   cm/s     ---------  Aortic valve VTI, S                       98.3  cm       ---------  Aortic mean gradient, S                   49    mm Hg    ---------  Aortic peak gradient, S                   90    mm Hg    ---------  VTI ratio, LVOT/AV  0.25           ---------  Aortic valve area, VTI                    0.86  cm^2     ---------  Aortic valve area/bsa, VTI                0.39  cm^2/m^2 ---------  Velocity ratio, peak, LVOT/AV             0.21           ---------  Aortic valve area, peak velocity          0.72  cm^2     ---------  Aortic valve area/bsa, peak               0.33  cm^2/m^2 ---------  velocity  Velocity ratio, mean, LVOT/AV             0.2            ---------  Aortic valve area, mean velocity          0.71   cm^2     ---------  Aortic valve area/bsa, mean               0.32  cm^2/m^2 ---------  velocity    Aorta                                     Value          Reference  Aortic root ID, ED                        38    mm       ---------  Ascending aorta ID, A-P, S                36    mm       ---------    Left atrium                               Value          Reference  LA ID, A-P, ES                            46    mm       ---------  LA ID/bsa, A-P                            2.09  cm/m^2   <=2.2  LA volume, S                              77.6  ml       ---------  LA volume/bsa, S                          35.2  ml/m^2   ---------  LA volume, ES, 1-p A4C                    74.3  ml       ---------  LA volume/bsa, ES, 1-p A4C  33.7  ml/m^2   ---------  LA volume, ES, 1-p A2C                    75.4  ml       ---------  LA volume/bsa, ES, 1-p A2C                34.2  ml/m^2   ---------    Mitral valve                              Value          Reference  Mitral E-wave peak velocity               73.7  cm/s     ---------  Mitral A-wave peak velocity               106   cm/s     ---------  Mitral deceleration time          (H)     384   ms       150 - 230  Mitral pressure half-time                 112   ms       ---------  Mitral peak gradient, D                   2     mm Hg    ---------  Mitral E/A ratio, peak                    0.7            ---------  Mitral valve area, PHT, DP                1.96  cm^2     ---------  Mitral valve area/bsa, PHT, DP            0.89  cm^2/m^2 ---------    Systemic veins                            Value          Reference  Estimated CVP                             3     mm Hg    ---------    Right ventricle                           Value          Reference  RV ID, minor axis, ED, A4C base           42    mm       ---------  TAPSE                                     17.3  mm       ---------  RV s&', lateral, S                          13.6  cm/s     ---------  Legend: (L)  and  (  H)  mark values outside specified reference range.  ------------------------------------------------------------------- Prepared and Electronically Authenticated by  Charlton Haws, M.D. 2019-04-17T15:06:58   RIGHT/LEFT HEART CATH AND CORONARY ANGIOGRAPHY  Conclusion     Ost RCA to Prox RCA lesion is 30% stenosed.  Prox RCA to Mid RCA lesion is 20% stenosed.  Ost RPDA lesion is 30% stenosed.  Prox Cx lesion is 60% stenosed.  Dist LAD lesion is 80% stenosed.  Prox LAD lesion is 50% stenosed.  Prox LAD to Mid LAD lesion is 30% stenosed.  Ost 1st Diag lesion is 60% stenosed.  There is severe aortic valve stenosis.  Ost 2nd Mrg lesion is 30% stenosed.  Mid Cx lesion is 30% stenosed.   1. The Left main has no obstructive disease 2. The LAD is a large caliber vessel in the proximal and mid vessel and then becomes smaller in caliber in the distal vessel. The mid LAD has a moderate stenosis that does not appear to be flow limiting. The distal LAD is smaller in caliber. There is a focal severe stenosis in the distal LAD. This vessel appears to be too small for stenting.  3. The Circumflex is a large caliber vessel with a moderate proximal stenosis just before the takeoff of a large obtuse marginal branch. The large caliber first obtuse marginal branch is free of obstructive disease.  4. The RCA is a large, dominant vessel with mild ostial stenosis, mild disease in the mid and distal vessel. There is mild disease in the moderate caliber PDA.  5. Severe aortic stenosis (mean gradient 42.1 mmHg, peak to peak gradient 49 mmHg, AVA 1.2 cm2).   Recommendations: I think his CAD can be managed at this time with medical therapy. He is having no angina. The LAD lesion is distal and the vessel is small in caliber (not favorable for stenting) and this only supplies the apical segment of myocardium. The Circumflex lesion is moderate. Will proceed  with workup for AVR vs TAVR. He has an appointment with Dr. Cornelius Moras next week. I suspect that he will be a good TAVR candidate.    Indications   Severe aortic stenosis [I35.0 (ICD-10-CM)]  Procedural Details/Technique   Technical Details Indication: 72 yo male with severe AS, workup for AVR vs TAVR.   Procedure: The risks, benefits, complications, treatment options, and expected outcomes were discussed with the patient. The patient and/or family concurred with the proposed plan, giving informed consent. The patient was brought to the cath lab after IV hydration was given. The patient was further sedated with Versed and Fentanyl. There was an IV catheter present in the right antecubital vein. This area was prepped and draped. I then changed out this IV catheter for a 5 French sheath. Right heart cath performed with a balloon tipped catheter. The right wrist was prepped and draped in a sterile fashion. 1% lidocaine was used for local anesthesia. Using the modified Seldinger access technique, a 5 French sheath was placed in the right radial artery. 3 mg Verapamil was given through the sheath. 4500 units IV heparin was given. Standard diagnostic catheters were used to perform selective coronary angiography. I crossed the aortic valve with an AL-1 catheter and a straight wire. LV pressures measured. The sheath was removed from the right radial artery and a Terumo hemostasis band was applied at the arteriotomy site on the right wrist.     Estimated blood loss <50 mL.  During this procedure the patient was administered the following to achieve and maintain  moderate conscious sedation: Versed 1 mg, Fentanyl 25 mcg, while the patient's heart rate, blood pressure, and oxygen saturation were continuously monitored. The period of conscious sedation was 32 minutes, of which I was present face-to-face 100% of this time.  Complications   Complications documented before study signed (04/30/2017 9:40 AM EDT)      RIGHT/LEFT HEART CATH AND CORONARY ANGIOGRAPHY   None Documented by Kathleene Hazel, MD 04/30/2017 9:18 AM EDT  Time Range: Intraprocedure      Coronary Findings   Diagnostic  Dominance: Right  Left Anterior Descending  Vessel is large.  Prox LAD lesion 50% stenosed  Prox LAD lesion is 50% stenosed.  Prox LAD to Mid LAD lesion 30% stenosed  Prox LAD to Mid LAD lesion is 30% stenosed.  Dist LAD lesion 80% stenosed  Dist LAD lesion is 80% stenosed.  First Diagonal Branch  Vessel is moderate in size.  Ost 1st Diag lesion 60% stenosed  Ost 1st Diag lesion is 60% stenosed.  Left Circumflex  Vessel is moderate in size.  Prox Cx lesion 60% stenosed  Prox Cx lesion is 60% stenosed.  Mid Cx lesion 30% stenosed  Mid Cx lesion is 30% stenosed.  First Obtuse Marginal Branch  Vessel is small in size.  Second Obtuse Marginal Branch  Vessel is moderate in size.  Ost 2nd Mrg lesion 30% stenosed  Ost 2nd Mrg lesion is 30% stenosed.  Right Coronary Artery  Vessel is large.  Ost RCA to Prox RCA lesion 30% stenosed  Ost RCA to Prox RCA lesion is 30% stenosed.  Prox RCA to Mid RCA lesion 20% stenosed  Prox RCA to Mid RCA lesion is 20% stenosed.  Right Posterior Descending Artery  Vessel is moderate in size.  Ost RPDA lesion 30% stenosed  Ost RPDA lesion is 30% stenosed.  Right Posterior Atrioventricular Branch  Vessel is moderate in size.  First Right Posterolateral  Vessel is moderate in size.  Intervention   No interventions have been documented.  Left Heart   Aortic Valve There is severe aortic valve stenosis.  Coronary Diagrams   Diagnostic Diagram       Implants    No implant documentation for this case.  MERGE Images   Show images for CARDIAC CATHETERIZATION   Link to Procedure Log   Procedure Log    Hemo Data    Most Recent Value  Fick Cardiac Output 7.01 L/min  Fick Cardiac Output Index 3.19 (L/min)/BSA  Aortic Mean Gradient 42.1 mmHg  Aortic  Peak Gradient 49 mmHg  Aortic Valve Area 1.20  Aortic Value Area Index 0.55 cm2/BSA  RA A Wave 6 mmHg  RA V Wave 5 mmHg  RA Mean 3 mmHg  RV Systolic Pressure 28 mmHg  RV Diastolic Pressure 0 mmHg  RV EDP 5 mmHg  PA Systolic Pressure 28 mmHg  PA Diastolic Pressure 10 mmHg  PA Mean 17 mmHg  PW A Wave 8 mmHg  PW V Wave 8 mmHg  PW Mean 6 mmHg  AO Systolic Pressure 121 mmHg  AO Diastolic Pressure 64 mmHg  AO Mean 87 mmHg  LV Systolic Pressure 174 mmHg  LV Diastolic Pressure 5 mmHg  LV EDP 16 mmHg  Arterial Occlusion Pressure Extended Systolic Pressure 125 mmHg  Arterial Occlusion Pressure Extended Diastolic Pressure 62 mmHg  Arterial Occlusion Pressure Extended Mean Pressure 88 mmHg  Left Ventricular Apex Extended Systolic Pressure 174 mmHg  Left Ventricular Apex Extended Diastolic Pressure 5 mmHg  Left Ventricular Apex  Extended EDP Pressure 17 mmHg  QP/QS 1  TPVR Index 5.34 HRUI  TSVR Index 27.29 HRUI  PVR SVR Ratio 0.13  TPVR/TSVR Ratio 0.2     STS Risk Calculator  Procedure: AVR + CAB CALCULATE   Risk of Mortality:  2.274% Renal Failure:  3.567% Permanent Stroke:  1.396% Prolonged Ventilation:  8.059% DSW Infection:  0.177% Reoperation:  4.860% Morbidity or Mortality:  12.722% Short Length of Stay:  31.229% Long Length of Stay:  7.228%      Impression:  Patient has stage D severe symptomatic aortic stenosis.  He describes a long history of symptoms of exertional shortness of breath and fatigue which have progressed somewhat recently, consistent with chronic diastolic congestive heart failure, New York Heart Association functional class II.  Recently he has also been experiencing frequent dizzy spells and his blood pressure has been running much lower, causing him to gradually decrease his dose of losartan.  He has not had any syncopal episodes.  He denies any exertional chest pain or chest tightness suspicious for angina pectoris.  I have personally  reviewed the patient's recent transthoracic echocardiogram and diagnostic cardiac catheterization.  Echocardiogram confirmed the presence of severe aortic stenosis.  Peak velocity across aortic valve measured 4.8 m/s corresponding to mean transvalvular gradient estimated 49 mmHg.  The aortic valve is trileaflet with severe thickening, calcification, and restricted leaflet mobility involving all 3 leaflets.  Left ventricular systolic function remains normal.  Diagnostic cardiac catheterization confirmed the presence of severe aortic stenosis and revealed moderate coronary artery disease.  There is moderate long segment 50% stenosis of the proximal and mid left anterior descending coronary artery as well as proximal stenosis of the diagonal branch.  There is high-grade stenosis of the distal left anterior descending coronary artery close to the apex.  There is no question the patient needs aortic valve replacement.  I suspect that risks associated with conventional surgical aortic valve replacement with or without coronary artery bypass grafting would be moderately elevated because of the patient's age and comorbid medical conditions.  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  It might be reasonable to consider transcatheter aortic valve replacement as an alternative to conventional surgery with continued medical therapy for treatment of the patient's coronary artery disease, presuming that the patient has anatomical characteristics suitable for transcatheter aortic valve replacement with relatively low risk.   Plan:  The patient was counseled at length regarding treatment alternatives for management of severe symptomatic aortic stenosis and moderate coronary artery disease.  Alternative approaches such as conventional aortic valve replacement with or without coronary  artery bypass grafting, transcatheter aortic valve replacement, and continued medical therapy without intervention were compared and contrasted at length.  The risks associated with conventional surgical aortic valve replacement were discussed in detail, as were expectations for post-operative convalescence.  Issues specific to transcatheter aortic valve replacement were discussed including questions about long term valve durability, the potential for paravalvular leak, possible increased risk of need for permanent pacemaker placement, and other technical complications related to the procedure itself.  Long-term prognosis with medical therapy was discussed. This discussion was placed in the context of the patient's own specific clinical presentation and past medical history.  All of their questions have been addressed.  The patient hopes to proceed with transcatheter aortic valve replacement as soon as possible.  As a next step the  patient will need to undergo CT angiography to evaluate the feasibility of transcatheter aortic valve replacement.  This will also allow [place] to further estimate risks associated with conventional surgery due to in-depth evaluation of the aortic root and ascending thoracic aorta.  The patient will undergo formal pulmonary function testing to assess the severity of his underlying chronic lung disease.  Patient will be referred for a second surgical opinion and hopes to proceed with transcatheter aortic valve replacement on Jun 03, 2017 if possible.  During the interim I have instructed the patient to stop taking his losartan but to continue to measure his blood pressure on a daily basis.  He will avoid strenuous activity and notify us if he continues to have significant dizzy spells.  He has also been instructed to avoid driving an automobile.   I spent in excess of 90 minutes during the conduct of this office consultation and >50% of this time involved direct face-to-face encounter  with the patient for counseling and/or coordination of their care.     Salvatore Decent. Cornelius Moras, MD 05/14/2017 2:34 PM

## 2017-05-15 ENCOUNTER — Other Ambulatory Visit: Payer: Self-pay

## 2017-05-16 ENCOUNTER — Encounter: Payer: Medicare PPO | Admitting: Thoracic Surgery (Cardiothoracic Vascular Surgery)

## 2017-05-20 ENCOUNTER — Other Ambulatory Visit: Payer: Self-pay

## 2017-05-20 ENCOUNTER — Ambulatory Visit (HOSPITAL_COMMUNITY)
Admission: RE | Admit: 2017-05-20 | Discharge: 2017-05-20 | Disposition: A | Discharge status: Home / Self Care | Payer: Medicare PPO | Source: Ambulatory Visit | Attending: Thoracic Surgery (Cardiothoracic Vascular Surgery) | Admitting: Thoracic Surgery (Cardiothoracic Vascular Surgery)

## 2017-05-20 ENCOUNTER — Ambulatory Visit: Payer: Medicare PPO | Attending: Cardiovascular Disease | Admitting: Physical Therapy

## 2017-05-20 ENCOUNTER — Ambulatory Visit (HOSPITAL_BASED_OUTPATIENT_CLINIC_OR_DEPARTMENT_OTHER)
Admission: RE | Admit: 2017-05-20 | Discharge: 2017-05-20 | Disposition: A | Discharge status: Home / Self Care | Payer: Medicare PPO | Source: Ambulatory Visit | Attending: Thoracic Surgery (Cardiothoracic Vascular Surgery) | Admitting: Thoracic Surgery (Cardiothoracic Vascular Surgery)

## 2017-05-20 ENCOUNTER — Encounter: Payer: Self-pay | Admitting: Physical Therapy

## 2017-05-20 DIAGNOSIS — R942 Abnormal results of pulmonary function studies: Secondary | ICD-10-CM | POA: Diagnosis not present

## 2017-05-20 DIAGNOSIS — R293 Abnormal posture: Secondary | ICD-10-CM | POA: Diagnosis not present

## 2017-05-20 DIAGNOSIS — I350 Nonrheumatic aortic (valve) stenosis: Secondary | ICD-10-CM | POA: Diagnosis not present

## 2017-05-20 DIAGNOSIS — I517 Cardiomegaly: Secondary | ICD-10-CM | POA: Insufficient documentation

## 2017-05-20 DIAGNOSIS — I358 Other nonrheumatic aortic valve disorders: Secondary | ICD-10-CM | POA: Diagnosis not present

## 2017-05-20 DIAGNOSIS — I700 Atherosclerosis of aorta: Secondary | ICD-10-CM | POA: Diagnosis not present

## 2017-05-20 DIAGNOSIS — R0600 Dyspnea, unspecified: Secondary | ICD-10-CM

## 2017-05-20 DIAGNOSIS — R918 Other nonspecific abnormal finding of lung field: Secondary | ICD-10-CM | POA: Insufficient documentation

## 2017-05-20 DIAGNOSIS — I2510 Atherosclerotic heart disease of native coronary artery without angina pectoris: Secondary | ICD-10-CM | POA: Insufficient documentation

## 2017-05-20 DIAGNOSIS — K5730 Diverticulosis of large intestine without perforation or abscess without bleeding: Secondary | ICD-10-CM | POA: Insufficient documentation

## 2017-05-20 LAB — PULMONARY FUNCTION TEST
DL/VA % PRED: 82 %
DL/VA: 3.96 ml/min/mmHg/L
DLCO unc % pred: 46 %
DLCO unc: 17.58 ml/min/mmHg
FEF 25-75 POST: 4.06 L/s
FEF 25-75 Pre: 3.06 L/sec
FEF2575-%CHANGE-POST: 32 %
FEF2575-%PRED-PRE: 115 %
FEF2575-%Pred-Post: 153 %
FEV1-%Change-Post: 9 %
FEV1-%PRED-PRE: 68 %
FEV1-%Pred-Post: 74 %
FEV1-POST: 2.71 L
FEV1-Pre: 2.48 L
FEV1FVC-%CHANGE-POST: 6 %
FEV1FVC-%Pred-Pre: 113 %
FEV6-%CHANGE-POST: 2 %
FEV6-%Pred-Post: 65 %
FEV6-%Pred-Pre: 63 %
FEV6-Post: 3.06 L
FEV6-Pre: 2.99 L
FEV6FVC-%Pred-Post: 105 %
FEV6FVC-%Pred-Pre: 105 %
FVC-%CHANGE-POST: 2 %
FVC-%PRED-POST: 61 %
FVC-%Pred-Pre: 60 %
FVC-Post: 3.06 L
FVC-Pre: 2.99 L
POST FEV1/FVC RATIO: 89 %
PRE FEV1/FVC RATIO: 83 %
Post FEV6/FVC ratio: 100 %
Pre FEV6/FVC Ratio: 100 %
RV % PRED: 73 %
RV: 2.01 L
TLC % pred: 62 %
TLC: 4.91 L

## 2017-05-20 MED ORDER — ALBUTEROL SULFATE (2.5 MG/3ML) 0.083% IN NEBU
2.5000 mg | INHALATION_SOLUTION | Freq: Once | RESPIRATORY_TRACT | Status: AC
  Administered 2017-05-20: 2.5 mg via RESPIRATORY_TRACT

## 2017-05-20 MED ORDER — IOPAMIDOL (ISOVUE-370) INJECTION 76%
INTRAVENOUS | Status: AC
  Administered 2017-05-20: 100 mL via INTRAVENOUS
  Filled 2017-05-20: qty 100

## 2017-05-20 NOTE — Progress Notes (Signed)
Preliminary results by tech - Carotid Duplex Completed. No evidence of a significant stenosis noted in bilateral carotid arteries - 1-39%. Marilynne Halsted, BS, RDMS, RVT

## 2017-05-20 NOTE — Therapy (Signed)
[facility] [address] [place], [place], [address] Phone: [phone]   Fax:  [phone]  Physical Therapy Evaluation  Patient Details  Name: Jason Anthony MRN: [id number] Date of Birth: 1945-05-03 Referring Provider: Verne Carrow MD   Encounter Date: 05/20/2017  PT End of Session - 05/20/17 1327    Visit Number  1    Number of Visits  1    Date for PT Re-Evaluation  05/20/17    PT Start Time  1327    PT Stop Time  1400    PT Time Calculation (min)  33 min    Activity Tolerance  Patient tolerated treatment well    Behavior During Therapy  [facility] for tasks assessed/performed       Past Medical History:  Diagnosis Date  . Acute renal failure (HCC)   . Anxiety   . Arthritis    "left shoulder" (04/30/2017)  . B12 deficiency anemia   . Bilateral impacted cerumen   . Chronic lower back pain   . DDD (degenerative disc disease), lumbar   . DOE (dyspnea on exertion)   . Factitious dermatitis   . Heart murmur   . Hepatitis   . History of gout    "no problems w/it anymore; not on RX anymore" (04/30/2017)  . Hyperglycemia   . Hyperkalemia   . Hyperlipidemia   . Hypertension   . Joint pain in the shoulder/clavicle region   . Low back pain   . Occasional tremors   . Sarcoidosis   . Severe aortic stenosis   . Thrombocytopenia (HCC)     Past Surgical History:  Procedure Laterality Date  . CARDIAC CATHETERIZATION  04/30/2017  . HAMMER TOE SURGERY     "?toe; ?side"  . INGUINAL HERNIA REPAIR Right   . LAPAROSCOPIC CHOLECYSTECTOMY    . RIGHT/LEFT HEART CATH AND CORONARY ANGIOGRAPHY N/A 04/30/2017   Procedure: RIGHT/LEFT HEART CATH AND CORONARY ANGIOGRAPHY;  Surgeon: Kathleene Hazel, MD;  Location: [facility] INVASIVE CV LAB;  Service: Cardiovascular;  Laterality: N/A;  . SALIVARY STONE REMOVAL  1961   "spit gland had a stone on it"    There were no vitals filed for this visit.   Subjective Assessment - 05/20/17  1329    Subjective  pt reports hx heart murmur which he reports has been going on for about 3 years which has gradually worsened. Reports gradually worsened shortening of breath and feeling light headed and dizzy especially over the last month. reports feeling mildy dizzy today from the dye from the imaging    How long can you sit comfortably?  unlimited    How long can you stand comfortably?  20-30 min     How long can you walk comfortably?  unsure    Patient Stated Goals  to get heart better    Currently in Pain?  No/denies         [facility] PT Assessment - 05/20/17 1337      Assessment   Medical Diagnosis  severe aortic stenosis    Referring Provider  Verne Carrow MD    Onset Date/Surgical Date  -- 3 years ago     Hand Dominance  Right      Precautions   Precautions  None      Restrictions   Weight Bearing Restrictions  No      Balance Screen   Has the patient fallen in the past 6 months  No    Has the patient had  a decrease in activity level because of a fear of falling?   No    Is the patient reluctant to leave their home because of a fear of falling?   No      Home Environment   Living Environment  Private residence    Living Arrangements  Alone    Available Help at Discharge  Family;Available 24 hours/day nephew lives nearby    Type of Home  House    Home Layout  Laundry or work area in basement 13-14 R railing when descending      Prior Function   Level of Independence  Independent    Vocation  Part time employment works at [employer]  prolonged standing      Posture/Postural Control   Posture/Postural Control  Postural limitations    Postural Limitations  Rounded Shoulders;Forward head;Flexed trunk      AROM   Overall AROM   Within functional limits for tasks performed      Strength   Overall Strength  Within functional limits for tasks performed    Strength Assessment Site  Hand    Right Hand Grip (lbs)  80    Left Hand Grip  (lbs)  81       [facility] Pre-Surgical Assessment - 05/20/17 1337    5 Meter Walk Test- trial 1  3 sec    5 Meter Walk Test- trial 2  3 sec.     5 Meter Walk Test- trial 3  3 sec.    5 meter walk test average  3 sec    4 Stage Balance Test tolerated for:   3 sec.    4 Stage Balance Test Position  4    Sit To Stand Test- trial 1  17 sec.    ADL/IADL Independent with:  Bathing;Dressing;Finances;Meal prep    ADL/IADL Needs Assistance with:  [employer] work    ADL/IADL Fraility Index  Moderately frail    6 Minute Walk- Baseline  yes    BP (mmHg)  130/80    HR (bpm)  65    02 Sat (%RA)  97 %    Modified Borg Scale for Dyspnea  0.5- Very, very slight shortness of breath    Perceived Rate of Exertion (Borg)  9- very light    6 Minute Walk Post Test  yes    BP (mmHg)  127/79    HR (bpm)  91    02 Sat (%RA)  91 %    Modified Borg Scale for Dyspnea  2- Mild shortness of breath    Perceived Rate of Exertion (Borg)  11- Fairly light    Aerobic Endurance Distance Walked  1181    Endurance additional comments  pt ambulated 31.69% disbaility per age related norm              Objective measurements completed on examination: See above findings.                           Plan - 05/20/17 1401    Clinical Impression Statement  See assessment in note    Clinical Presentation  Stable    Clinical Decision Making  Low    PT Frequency  One time visit    PT Next Visit Plan  PRE-TAVR    Consulted and Agree with Plan of Care  Patient       Clinical Impression Statement: Pt is a 72 yo  M presenting to OP PT for evaluation prior to possible TAVR surgery due to severe aortic stenosis. Pt reports onset of 3 approximately years ago with recent exacerbation 1-2  months ago. Symptoms are limiting endurance. Pt presents with good ROM and strength, good balance and is mild at high fall risk 4 stage balance test, good walking speed and moderate aerobic endurance per 6 minute walk test. Pt  ambulated 1181 feet without rest break and ended with 91 bpm and O2 was 91% on room air. Pt reported 2/10 shortness of breath on modified scale for dyspnea. SOB, and HR increased significantly with 6 minute walk test. Based on the Short Physical Performance Battery, patient has a frailty rating of 9/12 with </= 5/12 considered frail.    Patient demonstrated the following deficits and impairments:     Visit Diagnosis: Abnormal posture     Problem List Patient Active Problem List   Diagnosis Date Noted  . Severe aortic stenosis 04/30/2017  . Aortic stenosis, severe    Lulu Riding PT, DPT, LAT, ATC  05/20/17  2:06 PM      [facility] [address] [place], [place], [address] Phone: [phone]   Fax:  [phone]  Name: Jason Anthony MRN: [id number] Date of Birth: March 24, 1945

## 2017-05-23 ENCOUNTER — Ambulatory Visit: Payer: Medicare PPO | Admitting: Physician Assistant

## 2017-05-26 ENCOUNTER — Other Ambulatory Visit: Payer: Self-pay

## 2017-05-26 DIAGNOSIS — I350 Nonrheumatic aortic (valve) stenosis: Secondary | ICD-10-CM

## 2017-05-27 NOTE — Pre-Procedure Instructions (Signed)
Lux Meaders  05/27/2017      [facility] - [place], [place] - [address] [place] [address] Phone: [phone] Fax: [phone]    Your procedure is scheduled on May 28  Report to [facility] Admitting at 800 A.M.  Call this number if you have problems the morning of surgery:  [phone]   Remember:  No food  Or drink after midnight.                        Take these medicines the morning of surgery with A SIP OF WATER as instructed by your Dr  Keith Rake aspirin as directed by your Dr.  Stop taking BC's, Goody's, Herbal medications Fish Oil, aleve, Ibuprofen, Advil, Motrin, Vitamins 5-7 days prior to your surgery    Do not wear jewelry, make-up or nail polish.  Do not wear lotions, powders, or perfumes, or deodorant.  Do not shave 48 hours prior to surgery.  Men may shave face and neck.  Do not bring valuables to the hospital.  [facility] is not responsible for any belongings or valuables.  Contacts, dentures or bridgework may not be worn into surgery.  Leave your suitcase in the car.  After surgery it may be brought to your room.  For patients admitted to the hospital, discharge time will be determined by your treatment team.  Patients discharged the day of surgery will not be allowed to drive home.   Special instructions:   [place]- Preparing For Surgery  Before surgery, you can play an important role. Because skin is not sterile, your skin needs to be as free of germs as possible. You can reduce the number of germs on your skin by washing with CHG (chlorahexidine gluconate) Soap before surgery.  CHG is an antiseptic cleaner which kills germs and bonds with the skin to continue killing germs even after washing.    Oral Hygiene is also important to reduce your risk of infection.  Remember - BRUSH YOUR TEETH THE MORNING OF SURGERY WITH YOUR REGULAR TOOTHPASTE  Please do not use if you have an allergy to  CHG or antibacterial soaps. If your skin becomes reddened/irritated stop using the CHG.  Do not shave (including legs and underarms) for at least 48 hours prior to first CHG shower. It is OK to shave your face.  Please follow these instructions carefully.   1. Shower the NIGHT BEFORE SURGERY and the MORNING OF SURGERY with CHG.   2. If you chose to wash your hair, wash your hair first as usual with your normal shampoo.  3. After you shampoo, rinse your hair and body thoroughly to remove the shampoo.  4. Use CHG as you would any other liquid soap. You can apply CHG directly to the skin and wash gently with a scrungie or a clean washcloth.   5. Apply the CHG Soap to your body ONLY FROM THE NECK DOWN.  Do not use on open wounds or open sores. Avoid contact with your eyes, ears, mouth and genitals (private parts). Wash Face and genitals (private parts)  with your normal soap.  6. Wash thoroughly, paying special attention to the area where your surgery will be performed.  7. Thoroughly rinse your body with warm water from the neck down.  8. DO NOT shower/wash with your normal soap after using and rinsing off the CHG Soap.  9. Pat yourself dry with a CLEAN TOWEL.  10. Wear CLEAN PAJAMAS to bed the night before surgery, wear comfortable clothes the morning of surgery  11. Place CLEAN SHEETS on your bed the night of your first shower and DO NOT SLEEP WITH PETS.    Day of Surgery:  Do not apply any deodorants/lotions.  Please wear clean clothes to the hospital/surgery center.   Remember to brush your teeth WITH YOUR REGULAR TOOTHPASTE.    Please read over the following fact sheets that you were given. Pain Booklet, Coughing and Deep Breathing, MRSA Information and Surgical Site Infection Prevention

## 2017-05-28 ENCOUNTER — Encounter (HOSPITAL_COMMUNITY): Payer: Self-pay

## 2017-05-28 ENCOUNTER — Encounter: Payer: Self-pay | Admitting: Surgery

## 2017-05-28 ENCOUNTER — Ambulatory Visit (HOSPITAL_COMMUNITY)
Admission: RE | Admit: 2017-05-28 | Discharge: 2017-05-28 | Disposition: A | Discharge status: Home / Self Care | Payer: Medicare PPO | Source: Ambulatory Visit | Attending: Cardiovascular Disease | Admitting: Cardiovascular Disease

## 2017-05-28 ENCOUNTER — Other Ambulatory Visit: Payer: Self-pay

## 2017-05-28 ENCOUNTER — Encounter (HOSPITAL_COMMUNITY)
Admission: RE | Admit: 2017-05-28 | Discharge: 2017-05-28 | Disposition: A | Discharge status: Home / Self Care | Payer: Medicare PPO | Source: Ambulatory Visit | Attending: Cardiovascular Disease | Admitting: Cardiovascular Disease

## 2017-05-28 ENCOUNTER — Institutional Professional Consult (permissible substitution): Payer: Medicare PPO | Admitting: Surgery

## 2017-05-28 ENCOUNTER — Ambulatory Visit (HOSPITAL_COMMUNITY): Admission: RE | Admit: 2017-05-28 | Payer: Medicare PPO | Source: Ambulatory Visit

## 2017-05-28 VITALS — BP 116/75 | HR 71 | Resp 16 | Ht 74.0 in | Wt 195.0 lb

## 2017-05-28 DIAGNOSIS — Z01818 Encounter for other preprocedural examination: Secondary | ICD-10-CM | POA: Insufficient documentation

## 2017-05-28 DIAGNOSIS — Z7902 Long term (current) use of antithrombotics/antiplatelets: Secondary | ICD-10-CM | POA: Insufficient documentation

## 2017-05-28 DIAGNOSIS — D696 Thrombocytopenia, unspecified: Secondary | ICD-10-CM | POA: Diagnosis not present

## 2017-05-28 DIAGNOSIS — Z01810 Encounter for preprocedural cardiovascular examination: Secondary | ICD-10-CM | POA: Diagnosis not present

## 2017-05-28 DIAGNOSIS — I129 Hypertensive chronic kidney disease with stage 1 through stage 4 chronic kidney disease, or unspecified chronic kidney disease: Secondary | ICD-10-CM | POA: Diagnosis not present

## 2017-05-28 DIAGNOSIS — I350 Nonrheumatic aortic (valve) stenosis: Secondary | ICD-10-CM | POA: Diagnosis not present

## 2017-05-28 DIAGNOSIS — E785 Hyperlipidemia, unspecified: Secondary | ICD-10-CM | POA: Insufficient documentation

## 2017-05-28 DIAGNOSIS — Z7982 Long term (current) use of aspirin: Secondary | ICD-10-CM | POA: Insufficient documentation

## 2017-05-28 DIAGNOSIS — I444 Left anterior fascicular block: Secondary | ICD-10-CM | POA: Diagnosis not present

## 2017-05-28 DIAGNOSIS — N189 Chronic kidney disease, unspecified: Secondary | ICD-10-CM | POA: Diagnosis not present

## 2017-05-28 DIAGNOSIS — Z79899 Other long term (current) drug therapy: Secondary | ICD-10-CM | POA: Diagnosis not present

## 2017-05-28 LAB — CBC
HEMATOCRIT: 34.4 % — AB (ref 39.0–52.0)
HEMOGLOBIN: 11.7 g/dL — AB (ref 13.0–17.0)
MCH: 31.7 pg (ref 26.0–34.0)
MCHC: 34 g/dL (ref 30.0–36.0)
MCV: 93.2 fL (ref 78.0–100.0)
Platelets: 107 10*3/uL — ABNORMAL LOW (ref 150–400)
RBC: 3.69 MIL/uL — AB (ref 4.22–5.81)
RDW: 12.6 % (ref 11.5–15.5)
WBC: 4.4 10*3/uL (ref 4.0–10.5)

## 2017-05-28 LAB — COMPREHENSIVE METABOLIC PANEL
ALBUMIN: 3.9 g/dL (ref 3.5–5.0)
ALK PHOS: 52 U/L (ref 38–126)
ALT: 11 U/L — AB (ref 17–63)
AST: 22 U/L (ref 15–41)
Anion gap: 11 (ref 5–15)
BILIRUBIN TOTAL: 1.8 mg/dL — AB (ref 0.3–1.2)
BUN: 12 mg/dL (ref 6–20)
CALCIUM: 9.1 mg/dL (ref 8.9–10.3)
CO2: 24 mmol/L (ref 22–32)
CREATININE: 1.45 mg/dL — AB (ref 0.61–1.24)
Chloride: 105 mmol/L (ref 101–111)
GFR calc Af Amer: 53 mL/min — ABNORMAL LOW (ref >60)
GFR calc non Af Amer: 46 mL/min — ABNORMAL LOW (ref >60)
GLUCOSE: 147 mg/dL — AB (ref 65–99)
Potassium: 4.1 mmol/L (ref 3.5–5.1)
Sodium: 140 mmol/L (ref 135–145)
TOTAL PROTEIN: 7.1 g/dL (ref 6.5–8.1)

## 2017-05-28 LAB — TYPE AND SCREEN
ABO/RH(D): A POS
ANTIBODY SCREEN: NEGATIVE

## 2017-05-28 LAB — URINALYSIS, ROUTINE W REFLEX MICROSCOPIC
Bilirubin Urine: NEGATIVE
Glucose, UA: NEGATIVE mg/dL
Hgb urine dipstick: NEGATIVE
Ketones, ur: NEGATIVE mg/dL
Leukocytes, UA: NEGATIVE
NITRITE: NEGATIVE
PROTEIN: 30 mg/dL — AB
SPECIFIC GRAVITY, URINE: 1.025 (ref 1.005–1.030)
pH: 5 (ref 5.0–8.0)

## 2017-05-28 LAB — SURGICAL PCR SCREEN
MRSA, PCR: NEGATIVE
Staphylococcus aureus: NEGATIVE

## 2017-05-28 LAB — BRAIN NATRIURETIC PEPTIDE: B Natriuretic Peptide: 48.6 pg/mL (ref 0.0–100.0)

## 2017-05-28 LAB — HEMOGLOBIN A1C
Hgb A1c MFr Bld: 5 % (ref 4.8–5.6)
Mean Plasma Glucose: 96.8 mg/dL

## 2017-05-28 LAB — BLOOD GAS, ARTERIAL
ACID-BASE EXCESS: 2.1 mmol/L — AB (ref 0.0–2.0)
BICARBONATE: 26.3 mmol/L (ref 20.0–28.0)
Drawn by: 449841
FIO2: 0.21
O2 Saturation: 96.5 %
PCO2 ART: 42.8 mmHg (ref 32.0–48.0)
PH ART: 7.406 (ref 7.350–7.450)
Patient temperature: 98.6
pO2, Arterial: 82.1 mmHg — ABNORMAL LOW (ref 83.0–108.0)

## 2017-05-28 LAB — PROTIME-INR
INR: 1.02
Prothrombin Time: 13.3 seconds (ref 11.4–15.2)

## 2017-05-28 LAB — APTT: aPTT: 28 seconds (ref 24–36)

## 2017-05-28 LAB — ABO/RH: ABO/RH(D): A POS

## 2017-05-28 NOTE — Progress Notes (Signed)
Patient ID: Jessica Seidman, male   DOB: Nov 24, 1945, 72 y.o.   MRN: [id number]  [facility]  MULTIDISCIPLINARY HEART VALVE CLINIC  CARDIOTHORACIC SURGERY CONSULTATION REPORT  Referring Provider is Wendall Stade, MD PCP is Bedelia Person, MD  Reason for consultation: Severe aortic stenosis, second surgical evaluation for TAVR.  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  The patient has a known history of moderate aortic stenosis and was followed by a cardiologist in [place].  He had an echocardiogram in April 2018 which showed a peak velocity across aortic valve of 3.9 m/s corresponding to a mean transvalvular gradient of 36 mmHg and an aortic valve area of 0.61 cm.  He said that he was told that he may eventually need to have his aortic valve replaced but continued medical follow-up was recommended.  The patient self-referred to Dr. Eden Emms who takes care of 1 of his close friends and a nuclear stress test was performed showing ejection fraction of 51% with low risk for myocardial ischemia.  He had a repeat echocardiogram on April 23, 2017 showing significant progression of his aortic stenosis with a peak velocity of 4.8 m/s corresponding to a mean transvalvular gradient of 49 mmHg.  The dimensionless index was 0.21 with a calculated aortic valve area of 0.72 cm.  The left ventricular function was normal.  He underwent cardiac catheterization on April 30, 2017 showing severe aortic stenosis with a peak to peak gradient of 49 mmHg and a mean gradient of 42 mmHg.  There was moderate multivessel coronary disease with moderate stenosis of the proximal and mid LAD and severe stenosis in the distal  LAD.  There is mild nonobstructive disease in the left circumflex and right coronary artery territories.  The patient is here today with the daughter of a close friend who drove him.  He is divorced and has no children.  He has lived independently and works at [employer].  He has been physically active despite having long-standing exertional shortness of breath which he attributes to his pulmonary sarcoidosis.  He has exertional shortness of breath has progressed over the past 6 months and he has had some episodes of dizziness over the past several weeks.  His blood pressure has been noted to be low and his dose of losartan has been progressively decreased by Dr. Loleta Dicker and he is now off of that medication.  His blood pressure is now been running in the 120s and he feels much better.  He has not had any chest pressure or tightness.  He has had exertional fatigue.  He denies any symptoms at rest.  He denies orthopnea and PND.  Has had no peripheral edema.  Past Medical History:  Diagnosis Date  . Acute renal failure (HCC)   . Anxiety   . Arthritis    "left shoulder" (04/30/2017)  . Bilateral impacted cerumen   . Chronic lower back pain   . DDD (degenerative disc disease), lumbar   . DOE (dyspnea on exertion)   . Dyspnea   . Factitious dermatitis   . Heart murmur   . History of gout    "no problems w/it anymore; not on RX anymore" (04/30/2017)  . Hyperglycemia   . Hyperkalemia   . Hyperlipidemia   .  Hypertension   . Joint pain in the shoulder/clavicle region   . Low back pain   . Occasional tremors   . Pneumonia   . Sarcoidosis   . Severe aortic stenosis   . Thrombocytopenia (HCC)     Past Surgical History:  Procedure Laterality Date  . CARDIAC CATHETERIZATION  04/30/2017  . COLONOSCOPY    . HAMMER TOE SURGERY     "?toe; ?side"  . INGUINAL HERNIA REPAIR Right   . LAPAROSCOPIC CHOLECYSTECTOMY    . RIGHT/LEFT HEART CATH AND CORONARY ANGIOGRAPHY N/A 04/30/2017   Procedure: RIGHT/LEFT  HEART CATH AND CORONARY ANGIOGRAPHY;  Surgeon: Kathleene Hazel, MD;  Location: [facility];  Service: Cardiovascular;  Laterality: N/A;  . SALIVARY STONE REMOVAL  1961   "spit gland had a stone on it"    Family History  Problem Relation Age of Onset  . Hypertension Mother   . CVA Father 74  . Breast cancer Sister     Social History   Socioeconomic History  . Marital status: Divorced    Spouse name: Not on file  . Number of children: Not on file  . Years of education: Not on file  . Highest education level: Not on file  Occupational History  . Occupation: [employer]  Social Needs  . Financial resource strain: Not on file  . Food insecurity:    Worry: Not on file    Inability: Not on file  . Transportation needs:    Medical: Not on file    Non-medical: Not on file  Tobacco Use  . Smoking status: Never Smoker  . Smokeless tobacco: Never Used  Substance and Sexual Activity  . Alcohol use: No  . Drug use: Never  . Sexual activity: Not Currently  Lifestyle  . Physical activity:    Days per week: Not on file    Minutes per session: Not on file  . Stress: Not on file  Relationships  . Social connections:    Talks on phone: Not on file    Gets together: Not on file    Attends religious service: Not on file    Active member of club or organization: Not on file    Attends meetings of clubs or organizations: Not on file    Relationship status: Not on file  . Intimate partner violence:    Fear of current or ex partner: Not on file    Emotionally abused: Not on file    Physically abused: Not on file    Forced sexual activity: Not on file  Other Topics Concern  . Not on file  Social History Narrative  . Not on file    Current Outpatient Medications  Medication Sig Dispense Refill  . aspirin EC 81 MG tablet Take 1 tablet (81 mg total) by mouth daily. (Patient taking differently: Take 81 mg by mouth daily after breakfast. ) 90 tablet 3  . clonazePAM  (KLONOPIN) 0.5 MG tablet Take 0.5 mg by mouth 2 (two) times daily as needed for anxiety.    . simvastatin (ZOCOR) 40 MG tablet Take 40 mg by mouth daily after supper.     . traZODone (DESYREL) 50 MG tablet Take 100 mg by mouth at bedtime.      No current facility-administered medications for this visit.     No Known Allergies    Review of Systems:   General:  normal appetite, decreased energy, no weight gain, no weight loss, no fever  Cardiac:  no chest  pain with exertion, no chest pain at rest, +SOB with  exertion, no resting SOB, no PND, no orthopnea, no palpitations, no arrhythmia, no atrial fibrillation, no LE edema,  dizzy spells, no syncope  Respiratory:  + shortness of breath, no home oxygen, no productive cough, no dry cough, no bronchitis, no wheezing, no hemoptysis, no asthma, no pain with inspiration or cough, no sleep apnea, no CPAP at night  GI:   no difficulty swallowing, no reflux, no frequent heartburn, no hiatal hernia, no abdominal pain, no constipation, no diarrhea, no hematochezia, no hematemesis, no melena  GU:   no dysuria,  no frequency, no urinary tract infection, no hematuria, no enlarged prostate, no kidney stones, + kidney disease  Vascular:  no pain suggestive of claudication, no pain in feet, no leg cramps, no varicose veins, no DVT, no non-healing foot ulcer  Neuro:   no stroke, no TIA's, no seizures, no headaches, no temporary blindness one eye,  no slurred speech, no peripheral neuropathy, + chronic pain, mild instability of gait, no memory/cognitive dysfunction  Musculoskeletal: + arthritis, no joint swelling, no myalgias, mild difficulty walking, normal mobility   Skin:   no rash, no itching, no skin infections, no pressure sores or ulcerations  Psych:   + anxiety, no depression, + nervousness, + unusual recent stress  Eyes:   no blurry vision, no floaters, no recent vision changes, does not wear glasses or contacts  ENT:   no hearing loss, no loose or  painful teeth, no dentures, last saw dentist 6 months ago  Hematologic:  no easy bruising, no abnormal bleeding, no clotting disorder, no frequent epistaxis  Endocrine:  no diabetes, does not check CBG's at home     Physical Exam:   BP 116/75 (BP Location: Right Arm, Patient Position: Sitting, Cuff Size: Normal)   Pulse 71   Resp 16   Ht 6\' 2"  (1.88 m)   Wt 195 lb (88.5 kg)   SpO2 97% Comment: ON RA  BMI 25.04 kg/m   General:  Elderly, thin but well-appearing  HEENT:  Unremarkable, NCAT, PERLA, EOMI, oropharynx clear, teeth in good condition  Neck:   no JVD, no bruits, no adenopathy or thyromegaly  Chest:   clear to auscultation, symmetrical breath sounds, no wheezes, no rhonchi   CV:   RRR, grade III/VI crescendo/decrescendo murmur heard best at RSB,  no diastolic murmur  Abdomen:  soft, non-tender, no masses or organomegaly  Extremities:  warm, well-perfused, pulses palpable in feet, no LE edema  Rectal/GU  Deferred  Neuro:   Grossly non-focal and symmetrical throughout  Skin:   Clean and dry, no rashes, no breakdown   Diagnostic Tests:                          [facility]                        [address]                        [place], [place] [address]                            [phone]  ------------------------------------------------------------------- Transthoracic Echocardiography  Patient:    Keavon, Sensing MR #:       [id number] Study Date: 04/23/2017 Gender:     M Age:  72 Height:     188 cm Weight:     92.1 kg BSA:        2.2 m^2 Pt. Status: Room:   Layla Maw, M.D.  REFERRING    Charlton Haws, M.D.  ATTENDING    Armanda Magic, MD  SONOGRAPHER  Dewitt Hoes, RDCS  REFERRING    Clifton Custard, Caren T  PERFORMING   [facility], Outpatient  cc:  ------------------------------------------------------------------- LV EF: 60% -   65%  ------------------------------------------------------------------- Indications:       Aortic stenosis (I35).  ------------------------------------------------------------------- History:   PMH:   Dyspnea.  Aortic valve disease.  Risk factors: Sarcoidosis. Hypertension. Dyslipidemia.  ------------------------------------------------------------------- Study Conclusions  - Left ventricle: The cavity size was normal. Wall thickness was   increased in a pattern of mild LVH. Systolic function was normal.   The estimated ejection fraction was in the range of 60% to 65%.   Doppler parameters are consistent with abnormal left ventricular   relaxation (grade 1 diastolic dysfunction). - Aortic valve: There was critical stenosis. There was trivial   regurgitation. Mean gradient (S): 49 mm Hg. Peak gradient (S): 90   mm Hg. - Mitral valve: Calcified annulus. - Left atrium: The atrium was moderately dilated. - Atrial septum: No defect or patent foramen ovale was identified.  ------------------------------------------------------------------- Labs, prior tests, procedures, and surgery: Transthoracic echocardiography (05/06/2016).     EF was 60%. Aortic valve: peak gradient of 64 mm Hg and mean gradient of 39 mm Hg.  ------------------------------------------------------------------- Study data:  The previous study was not available, so comparison was made to the report of 05/06/2016.  Study status:  Routine. Procedure:  The patient reported no pain pre or post test. Transthoracic echocardiography. Image quality was adequate.  Study completion:  There were no complications.          Transthoracic echocardiography.  M-mode, complete 2D, 3D, spectral Doppler, and color Doppler.  Birthdate:  Patient birthdate: 1946-01-05.  Age: Patient is 72 yr old.  Sex:  Gender: male.    BMI: 26.1 kg/m^2. Blood pressure:     102/60  Patient status:  Outpatient.  Study date:  Study date: 04/23/2017. Study time: 02:16 PM.  Location: [facility]  -------------------------------------------------------------------  ------------------------------------------------------------------- Left ventricle:  The cavity size was normal. Wall thickness was increased in a pattern of mild LVH. Systolic function was normal. The estimated ejection fraction was in the range of 60% to 65%. Doppler parameters are consistent with abnormal left ventricular relaxation (grade 1 diastolic dysfunction).  ------------------------------------------------------------------- Aortic valve:   Probably trileaflet; severely thickened, severely calcified leaflets.  Doppler:   There was critical stenosis. There was trivial regurgitation.    VTI ratio of LVOT to aortic valve: 0.25. Valve area (VTI): 0.86 cm^2. Indexed valve area (VTI): 0.39 cm^2/m^2. Peak velocity ratio of LVOT to aortic valve: 0.21. Valve area (Vmax): 0.72 cm^2. Indexed valve area (Vmax): 0.33 cm^2/m^2. Mean velocity ratio of LVOT to aortic valve: 0.2. Valve area (Vmean): 0.71 cm^2. Indexed valve area (Vmean): 0.32 cm^2/m^2.    Mean gradient (S): 49 mm Hg. Peak gradient (S): 90 mm Hg.   ------------------------------------------------------------------- Mitral valve:   Calcified annulus.  Doppler:  There was trivial regurgitation.    Valve area by pressure half-time: 1.96 cm^2. Indexed valve area by pressure half-time: 0.89 cm^2/m^2.    Peak gradient (D): 2 mm Hg.  ------------------------------------------------------------------- Left atrium:  The atrium was moderately dilated.  ------------------------------------------------------------------- Atrial septum:  No defect or patent  foramen ovale was identified.   ------------------------------------------------------------------- Right ventricle:  The cavity size was normal. Wall thickness was normal. Systolic function was normal.  ------------------------------------------------------------------- Pulmonic valve:    Doppler:   There was mild regurgitation.  ------------------------------------------------------------------- Tricuspid valve:   Doppler:  There was mild regurgitation.  ------------------------------------------------------------------- Right atrium:  The atrium was normal in size.  ------------------------------------------------------------------- Pericardium:  The pericardium was normal in appearance.  ------------------------------------------------------------------- Systemic veins: Inferior vena cava: The vessel was normal in size. The respirophasic diameter changes were in the normal range (>= 50%), consistent with normal central venous pressure.  ------------------------------------------------------------------- Measurements   Left ventricle                            Value          Reference  LV ID, ED, PLAX chordal           (L)     42    mm       43 - 52  LV ID, ES, PLAX chordal                   27    mm       23 - 38  LV fx shortening, PLAX chordal            36    %        >=29  LV PW thickness, ED                       13    mm       ---------  IVS/LV PW ratio, ED                       1              <=1.3  Stroke volume, 2D                         85    ml       ---------  Stroke volume/bsa, 2D                     39    ml/m^2   ---------  LV e&', lateral                            7.4   cm/s     ---------  LV E/e&', lateral                          9.96           ---------  LV e&', medial                             5.66  cm/s     ---------  LV E/e&', medial                           13.02          ---------  LV e&', average                            6.53  cm/s     ---------  LV E/e&', average                          11.29          ---------    Ventricular septum                        Value          Reference  IVS thickness, ED                         13    mm       ---------    LVOT                                      Value          Reference  LVOT ID, S                                 21    mm       ---------  LVOT area                                 3.46  cm^2     ---------  LVOT peak velocity, S                     99    cm/s     ---------  LVOT mean velocity, S                     67.2  cm/s     ---------  LVOT VTI, S                               24.5  cm       ---------    Aortic valve                              Value          Reference  Aortic valve peak velocity, S             475   cm/s     ---------  Aortic valve mean velocity, S             329   cm/s     ---------  Aortic valve VTI, S                       98.3  cm       ---------  Aortic mean gradient, S                   49    mm Hg    ---------  Aortic peak gradient, S                   90    mm Hg    ---------  VTI ratio, LVOT/AV  0.25           ---------  Aortic valve area, VTI                    0.86  cm^2     ---------  Aortic valve area/bsa, VTI                0.39  cm^2/m^2 ---------  Velocity ratio, peak, LVOT/AV             0.21           ---------  Aortic valve area, peak velocity          0.72  cm^2     ---------  Aortic valve area/bsa, peak               0.33  cm^2/m^2 ---------  velocity  Velocity ratio, mean, LVOT/AV             0.2            ---------  Aortic valve area, mean velocity          0.71  cm^2     ---------  Aortic valve area/bsa, mean               0.32  cm^2/m^2 ---------  velocity    Aorta                                     Value          Reference  Aortic root ID, ED                        38    mm       ---------  Ascending aorta ID, A-P, S                36    mm       ---------    Left atrium                               Value          Reference  LA ID, A-P, ES                            46    mm       ---------  LA ID/bsa, A-P                            2.09  cm/m^2   <=2.2  LA volume, S                              77.6  ml       ---------  LA volume/bsa, S                          35.2  ml/m^2   ---------  LA  volume, ES, 1-p A4C                    74.3  ml       ---------  LA volume/bsa, ES, 1-p A4C  33.7  ml/m^2   ---------  LA volume, ES, 1-p A2C                    75.4  ml       ---------  LA volume/bsa, ES, 1-p A2C                34.2  ml/m^2   ---------    Mitral valve                              Value          Reference  Mitral E-wave peak velocity               73.7  cm/s     ---------  Mitral A-wave peak velocity               106   cm/s     ---------  Mitral deceleration time          (H)     384   ms       150 - 230  Mitral pressure half-time                 112   ms       ---------  Mitral peak gradient, D                   2     mm Hg    ---------  Mitral E/A ratio, peak                    0.7            ---------  Mitral valve area, PHT, DP                1.96  cm^2     ---------  Mitral valve area/bsa, PHT, DP            0.89  cm^2/m^2 ---------    Systemic veins                            Value          Reference  Estimated CVP                             3     mm Hg    ---------    Right ventricle                           Value          Reference  RV ID, minor axis, ED, A4C base           42    mm       ---------  TAPSE                                     17.3  mm       ---------  RV s&', lateral, S                         13.6  cm/s     ---------  Legend: (L)  and  (H)  mark values outside specified reference range.  ------------------------------------------------------------------- Prepared and Electronically Authenticated by  Charlton Haws, M.D. 2019-04-17T15:06:58  Physicians   Panel Physicians Referring Physician Case Authorizing Physician  Kathleene Hazel, MD (Primary)    Procedures   RIGHT/LEFT HEART CATH AND CORONARY ANGIOGRAPHY  Conclusion     Ost RCA to Prox RCA lesion is 30% stenosed.  Prox RCA to Mid RCA lesion is 20% stenosed.  Ost RPDA lesion is 30% stenosed.  Prox Cx lesion is 60% stenosed.  Dist LAD lesion is  80% stenosed.  Prox LAD lesion is 50% stenosed.  Prox LAD to Mid LAD lesion is 30% stenosed.  Ost 1st Diag lesion is 60% stenosed.  There is severe aortic valve stenosis.  Ost 2nd Mrg lesion is 30% stenosed.  Mid Cx lesion is 30% stenosed.   1. The Left main has no obstructive disease 2. The LAD is a large caliber vessel in the proximal and mid vessel and then becomes smaller in caliber in the distal vessel. The mid LAD has a moderate stenosis that does not appear to be flow limiting. The distal LAD is smaller in caliber. There is a focal severe stenosis in the distal LAD. This vessel appears to be too small for stenting.  3. The Circumflex is a large caliber vessel with a moderate proximal stenosis just before the takeoff of a large obtuse marginal branch. The large caliber first obtuse marginal branch is free of obstructive disease.  4. The RCA is a large, dominant vessel with mild ostial stenosis, mild disease in the mid and distal vessel. There is mild disease in the moderate caliber PDA.  5. Severe aortic stenosis (mean gradient 42.1 mmHg, peak to peak gradient 49 mmHg, AVA 1.2 cm2).   Recommendations: I think his CAD can be managed at this time with medical therapy. He is having no angina. The LAD lesion is distal and the vessel is small in caliber (not favorable for stenting) and this only supplies the apical segment of myocardium. The Circumflex lesion is moderate. Will proceed with workup for AVR vs TAVR. He has an appointment with Dr. Cornelius Moras next week. I suspect that he will be a good TAVR candidate.    Indications   Severe aortic stenosis [I35.0 (ICD-10-CM)]  Procedural Details/Technique   Technical Details Indication: 72 yo male with severe AS, workup for AVR vs TAVR.   Procedure: The risks, benefits, complications, treatment options, and expected outcomes were discussed with the patient. The patient and/or family concurred with the proposed plan, giving informed consent. The  patient was brought to the cath lab after IV hydration was given. The patient was further sedated with Versed and Fentanyl. There was an IV catheter present in the right antecubital vein. This area was prepped and draped. I then changed out this IV catheter for a 5 French sheath. Right heart cath performed with a balloon tipped catheter. The right wrist was prepped and draped in a sterile fashion. 1% lidocaine was used for local anesthesia. Using the modified Seldinger access technique, a 5 French sheath was placed in the right radial artery. 3 mg Verapamil was given through the sheath. 4500 units IV heparin was given. Standard diagnostic catheters were used to perform selective coronary angiography. I crossed the aortic valve with an AL-1 catheter and a straight wire. LV pressures measured. The sheath was removed from the right radial artery and a Terumo hemostasis band was applied at the arteriotomy site on the right wrist.  Estimated blood loss <50 mL.  During this procedure the patient was administered the following to achieve and maintain moderate conscious sedation: Versed 1 mg, Fentanyl 25 mcg, while the patient's heart rate, blood pressure, and oxygen saturation were continuously monitored. The period of conscious sedation was 32 minutes, of which I was present face-to-face 100% of this time.  Complications   Complications documented before study signed (04/30/2017 9:40 AM EDT)    RIGHT/LEFT HEART CATH AND CORONARY ANGIOGRAPHY   None Documented by Kathleene Hazel, MD 04/30/2017 9:18 AM EDT  Time Range: Intraprocedure      Coronary Findings   Diagnostic  Dominance: Right  Left Anterior Descending  Vessel is large.  Prox LAD lesion 50% stenosed  Prox LAD lesion is 50% stenosed.  Prox LAD to Mid LAD lesion 30% stenosed  Prox LAD to Mid LAD lesion is 30% stenosed.  Dist LAD lesion 80% stenosed  Dist LAD lesion is 80% stenosed.  First Diagonal Branch  Vessel is moderate in  size.  Ost 1st Diag lesion 60% stenosed  Ost 1st Diag lesion is 60% stenosed.  Left Circumflex  Vessel is moderate in size.  Prox Cx lesion 60% stenosed  Prox Cx lesion is 60% stenosed.  Mid Cx lesion 30% stenosed  Mid Cx lesion is 30% stenosed.  First Obtuse Marginal Branch  Vessel is small in size.  Second Obtuse Marginal Branch  Vessel is moderate in size.  Ost 2nd Mrg lesion 30% stenosed  Ost 2nd Mrg lesion is 30% stenosed.  Right Coronary Artery  Vessel is large.  Ost RCA to Prox RCA lesion 30% stenosed  Ost RCA to Prox RCA lesion is 30% stenosed.  Prox RCA to Mid RCA lesion 20% stenosed  Prox RCA to Mid RCA lesion is 20% stenosed.  Right Posterior Descending Artery  Vessel is moderate in size.  Ost RPDA lesion 30% stenosed  Ost RPDA lesion is 30% stenosed.  Right Posterior Atrioventricular Branch  Vessel is moderate in size.  First Right Posterolateral  Vessel is moderate in size.  Intervention   No interventions have been documented.  Left Heart   Aortic Valve There is severe aortic valve stenosis.  Coronary Diagrams   Diagnostic Diagram       Implants    No implant documentation for this case.  MERGE Images   Show images for CARDIAC CATHETERIZATION   Link to Procedure Log   Procedure Log    Hemo Data    Most Recent Value  Fick Cardiac Output 7.01 L/min  Fick Cardiac Output Index 3.19 (L/min)/BSA  Aortic Mean Gradient 42.1 mmHg  Aortic Peak Gradient 49 mmHg  Aortic Valve Area 1.20  Aortic Value Area Index 0.55 cm2/BSA  RA A Wave 6 mmHg  RA V Wave 5 mmHg  RA Mean 3 mmHg  RV Systolic Pressure 28 mmHg  RV Diastolic Pressure 0 mmHg  RV EDP 5 mmHg  PA Systolic Pressure 28 mmHg  PA Diastolic Pressure 10 mmHg  PA Mean 17 mmHg  PW A Wave 8 mmHg  PW V Wave 8 mmHg  PW Mean 6 mmHg  AO Systolic Pressure 121 mmHg  AO Diastolic Pressure 64 mmHg  AO Mean 87 mmHg  LV Systolic Pressure 174 mmHg  LV Diastolic Pressure 5 mmHg  LV EDP 16 mmHg    Arterial Occlusion Pressure Extended Systolic Pressure 125 mmHg  Arterial Occlusion Pressure Extended Diastolic Pressure 62 mmHg  Arterial Occlusion Pressure Extended Mean Pressure 88 mmHg  Left Ventricular  Apex Extended Systolic Pressure 174 mmHg  Left Ventricular Apex Extended Diastolic Pressure 5 mmHg  Left Ventricular Apex Extended EDP Pressure 17 mmHg  QP/QS 1  TPVR Index 5.34 HRUI  TSVR Index 27.29 HRUI  PVR SVR Ratio 0.13  TPVR/TSVR Ratio 0.2    ADDENDUM REPORT: 05/21/2017 16:28  CLINICAL DATA:  61 -year-old male with severe aortic stenosis being evaluated for a TAVR procedure.  EXAM: Cardiac TAVR CT  TECHNIQUE: The patient was scanned on a [employer]. A 120 kV retrospective scan was triggered in the descending thoracic aorta at 111 HU's. Gantry rotation speed was 250 msecs and collimation was .6 mm. No beta blockade or nitro were given. The 3D data set was reconstructed in 5% intervals of the R-R cycle. Systolic and diastolic phases were analyzed on a dedicated work station using MPR, MIP and VRT modes. The patient received 80 cc of contrast.  FINDINGS: Aortic Valve: Trileaflet aortic valve with severe leaflet thickening and calcifications with severely restricted leaflet opening. There are no calcifications extending into the LVOT.  Aorta: Normal size, mild calcifications, no dissection.  Sinotubular Junction: 31 x 29 mm  Ascending Thoracic Aorta: 36 x 35 mm  Aortic Arch: 31 x 28 mm  Descending Thoracic Aorta: 26 x 26 mm  Sinus of Valsalva Measurements:  Non-coronary: 38 mm  Right -coronary: 34 mm  Left -coronary: 36 mm  Coronary Artery Height above Annulus:  Left Main: 18 mm  Right Coronary: 22 mm  Virtual Basal Annulus Measurements:  Maximum/Minimum Diameter: 30.9 x 25.4 mm  Mean Diameter: 27.1 mm  Perimeter: 86.6 mm  Area:  576 mm2  Optimum Fluoroscopic Angle for Delivery:  LAO 10 CAU  7  IMPRESSION: 1. Trileaflet aortic valve with severe leaflet thickening and calcifications with severely restricted leaflet opening. There are no calcifications extending into the LVOT. Annular measurements suitable for delivery of a 29 mm Edwards-SAPIEN 3 valve.  2. Sufficient coronary to annulus distance.  3. Optimum Fluoroscopic Angle for Delivery: LAO 10 CAU 7  4. No thrombus in the left atrial appendage.   Electronically Signed   By: Tobias Alexander   On: 05/21/2017 16:28   Addended by Lars Masson, MD on 05/21/2017 4:31 PM    Study Result   EXAM: OVER-READ INTERPRETATION  CT CHEST  The following report is an over-read performed by radiologist Dr. Trudie Reed of [facility] on 05/20/2017. This over-read does not include interpretation of cardiac or coronary anatomy or pathology. The coronary calcium score/coronary CTA interpretation by the cardiologist is attached.  COMPARISON:  None.  FINDINGS: Extracardiac findings will be described separately under dictation for contemporaneously obtained CTA chest, abdomen and pelvis.  IMPRESSION: Please see separate dictation for contemporaneously obtained CTA chest, abdomen and pelvis dated 05/20/2017 for full description of relevant extracardiac findings.  Electronically Signed: By: Trudie Reed M.D. On: 05/20/2017 13:56      CLINICAL DATA:  72 year old male with history of severe aortic stenosis. Preprocedural study prior to potential transcatheter aortic valve replacement (TAVR) procedure.  EXAM: CT ANGIOGRAPHY CHEST, ABDOMEN AND PELVIS  TECHNIQUE: Multidetector CT imaging through the chest, abdomen and pelvis was performed using the standard protocol during bolus administration of intravenous contrast. Multiplanar reconstructed images and MIPs were obtained and reviewed to evaluate the vascular anatomy.  CONTRAST:  <See Chart> ISOVUE-370 IOPAMIDOL (ISOVUE-370)  INJECTION 76%  COMPARISON:  No priors.  FINDINGS: CTA CHEST FINDINGS  Cardiovascular: Heart size is mildly enlarged. Concentric left ventricular hypertrophy.  There is no significant pericardial fluid, thickening or pericardial calcification. There is aortic atherosclerosis, as well as atherosclerosis of the great vessels of the mediastinum and the coronary arteries, including calcified atherosclerotic plaque in the left main, left anterior descending, left circumflex and right coronary arteries. Severe thickening and calcification of the aortic valve. Moderate calcifications of the mitral annulus.  Mediastinum/Lymph Nodes: No pathologically enlarged mediastinal or hilar lymph nodes. Esophagus is unremarkable in appearance. No axillary lymphadenopathy.  Lungs/Pleura: Unusual areas of thickening of the peribronchovascular interstitium with some associated peribronchovascular ground-glass attenuation and consolidative changes most evident throughout the dependent portions of the lungs bilaterally. No frank honeycombing. No definite bronchiectasis. No pleural effusions. No suspicious appearing pulmonary nodules or masses.  Musculoskeletal/Soft Tissues: There are no aggressive appearing lytic or blastic lesions noted in the visualized portions of the skeleton.  CTA ABDOMEN AND PELVIS FINDINGS  Hepatobiliary: No cystic or solid hepatic lesions. No intra or extrahepatic biliary ductal dilatation. Status post cholecystectomy.  Pancreas: No pancreatic mass. No pancreatic ductal dilatation. No pancreatic or peripancreatic fluid or inflammatory changes.  Spleen: Unremarkable.  Adrenals/Urinary Tract: Multiple low-attenuation lesions in both kidneys, compatible with simple cysts, measuring up to 3.9 cm in the anterior aspect of the interpolar region. Several other subcentimeter low-attenuation lesions in both kidneys are too small to definitively characterize, but are  statistically likely to represent tiny cysts. Bilateral adrenal glands are normal in appearance. No hydroureteronephrosis. Urinary bladder is normal in appearance.  Stomach/Bowel: The appearance of the stomach is normal. There is no pathologic dilatation of small bowel or colon. Numerous colonic diverticulae are noted, without surrounding inflammatory changes to suggest an acute diverticulitis at this time. Normal appendix.  Vascular/Lymphatic: Aortic atherosclerosis, with vascular findings and measurements pertinent to potential TAVR procedure, as detailed below. No aneurysm or dissection noted in the abdominal or pelvic vasculature. The celiac axis, superior mesenteric artery and inferior mesenteric artery are all widely patent without hemodynamically significant stenosis. Single renal arteries are both widely patent without hemodynamically significant stenosis. No lymphadenopathy noted in the abdomen or pelvis.  Reproductive: Prostate gland and seminal vesicles are unremarkable in appearance.  Other: No significant volume of ascites.  No pneumoperitoneum.  Musculoskeletal: There are no aggressive appearing lytic or blastic lesions noted in the visualized portions of the skeleton.  VASCULAR MEASUREMENTS PERTINENT TO TAVR:  AORTA:  Minimal Aortic Diameter-17 x 19 mm  Severity of Aortic Calcification-mild  RIGHT PELVIS:  Right Common Iliac Artery -  Minimal Diameter-13.2 x 12.5 mm  Tortuosity-moderate  Calcification-moderate  Right External Iliac Artery -  Minimal Diameter-9.1 x 9.1 mm  Tortuosity-mild  Calcification-none  Right Common Femoral Artery -  Minimal Diameter-10.0 x 10.2 mm  Tortuosity-mild  Calcification-mild  LEFT PELVIS:  Left Common Iliac Artery -  Minimal Diameter-13.3 x 12.3 mm  Tortuosity-severe  Calcification-moderate  Left External Iliac Artery -  Minimal Diameter-9.1 x 9.2  mm  Tortuosity-mild  Calcification-none  Left Common Femoral Artery -  Minimal Diameter-10.4 x 9.5 mm  Tortuosity-mild  Calcification-mild  Review of the MIP images confirms the above findings.  IMPRESSION: 1. Vascular findings and measurements pertinent to potential TAVR procedure, as detailed above. 2. Severe thickening calcification of the aortic valve, compatible with the reported clinical history of severe aortic stenosis. 3. Cardiomegaly with concentric left ventricular hypertrophy. 4. Aortic atherosclerosis, in addition to left main and 3 vessel coronary artery disease. 5. Unusual appearance of the lungs which is of uncertain etiology and significance. Although a portion of these  findings could be attributable to a background of some interstitial pulmonary edema, the possibility of interstitial lung disease should be considered. Followup nonemergent outpatient high-resolution chest CT is suggested after the patient's procedure and once the patient has fully recovered to better evaluate for underlying interstitial lung disease. 6. Colonic diverticulosis without evidence of acute diverticulitis at this time. 7. Additional incidental findings, as above.  Aortic Atherosclerosis (ICD10-I70.0).   Electronically Signed   By: Trudie Reed M.D.   On: 05/20/2017 15:09   Impression:  This 72 year old gentleman has stage D, severe, symptomatic aortic stenosis with New York Heart Association class II symptoms of exertional fatigue and shortness of breath consistent with chronic diastolic congestive heart failure.  He is also been having frequent dizzy spells recently which are likely due to his relatively low blood pressure and severe aortic stenosis.  These have improved since stopping his losartan.  I have personally reviewed his echocardiogram, cardiac cath, and CTA studies.  His echocardiogram shows a trileaflet aortic valve with severely thickened and  calcified leaflets with restricted mobility.  The peak velocity across his aortic valve was 4.8 m/s corresponding to a mean transvalvular gradient of 49 mmHg.  Left ventricular systolic function is normal.  Cardiac catheterization showed severe aortic stenosis with a mean gradient of 42 mmHg and a peak to peak gradient of 49 mmHg.  There is a long segment of 50% stenosis of the proximal and mid LAD and a high-grade stenosis of the distal LAD close to the apex.  I agree that aortic valve replacement is indicated in this patient for improvement of his symptoms and to prevent progressive left ventricular deterioration.  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  I think transcatheter aortic valve replacement would be the best treatment for him with continued medical treatment of his moderate coronary artery disease.  His gated cardiac CTA shows a trileaflet aortic valve with severe thickening and calcification as well as severely restricted leaflet opening.  The anatomy is favorable for transcatheter aortic valve replacement with no complicating features.  His abdominal and pelvic CTA shows adequate pelvic vascular access to allow transfemoral insertion.  The patient was counseled at length regarding treatment alternatives for management of severe symptomatic aortic stenosis. The risks and benefits of surgical intervention have been discussed in detail. Long-term prognosis with medical therapy was discussed. Alternative approaches such as conventional surgical aortic valve replacement, transcatheter aortic valve replacement, and palliative medical therapy were compared and contrasted at length. This discussion was placed in the context of the patient's own  specific clinical presentation and past medical history. All of his questions have been addressed.   Following the decision to proceed with transcatheter aortic valve replacement, a discussion was held regarding what types of management strategies would be attempted intraoperatively in the event of life-threatening complications, including whether or not the patient would be considered a candidate for the use of cardiopulmonary bypass and/or conversion to open sternotomy for attempted surgical intervention.   The patient has been advised of a variety of complications that might develop including but not limited to risks of death, stroke, paravalvular leak, aortic dissection or other major vascular complications, aortic annulus rupture, device embolization, cardiac rupture or perforation, mitral regurgitation, acute myocardial infarction, arrhythmia, heart block  or bradycardia requiring permanent pacemaker placement, congestive heart failure, respiratory failure, renal failure, pneumonia, infection, other late complications related to structural valve deterioration or migration, or other complications that might ultimately cause a temporary or permanent loss of functional independence or other long term morbidity. The patient provides full informed consent for the procedure as described and all questions were answered.     Plan:  Transfemoral transcatheter aortic valve replacement on Tuesday, 06/03/2017.   I spent 60 minutes performing this consultation and > 50% of this time was spent face to face counseling and coordinating the care of this patient's severe symptomatic aortic stenosis.    Alleen Borne, MD 05/28/2017

## 2017-05-28 NOTE — Progress Notes (Signed)
PCP is Dr. Fanny Dance Cardiologist is Dr Eden Emms Echo noted 04-23-2017 Stress test noted 07-31-2016 Cath noted 04-30-2017 Denies fever or cough Reports that his chest doesn't feel right not really pain, on the left side of hie chest. States that Dr Cornelius Moras is aware of this encouraged to let Dr Laneta Simmers know this today also- voices understanding. Shelby Dubin, PA called and informed. States he was told to continue all his meds, just not to take aspirin on the day of surgery.

## 2017-05-28 NOTE — Progress Notes (Signed)
Message left with Julieta Gutting to inform of abnormal lab results.

## 2017-05-29 ENCOUNTER — Ambulatory Visit (HOSPITAL_COMMUNITY): Payer: Medicare PPO

## 2017-06-01 MED ORDER — DEXMEDETOMIDINE HCL IN NACL 400 MCG/100ML IV SOLN
0.1000 ug/kg/h | INTRAVENOUS | Status: AC
  Administered 2017-06-03: 6 ug/kg/h via INTRAVENOUS
  Filled 2017-06-01: qty 100

## 2017-06-01 MED ORDER — SODIUM CHLORIDE 0.9 % IV SOLN
INTRAVENOUS | Status: DC
  Filled 2017-06-01: qty 1

## 2017-06-01 MED ORDER — DOPAMINE-DEXTROSE 3.2-5 MG/ML-% IV SOLN
0.0000 ug/kg/min | INTRAVENOUS | Status: DC
  Filled 2017-06-01: qty 250

## 2017-06-01 MED ORDER — PHENYLEPHRINE HCL 10 MG/ML IJ SOLN
30.0000 ug/min | INTRAMUSCULAR | Status: DC
  Filled 2017-06-01: qty 2

## 2017-06-01 MED ORDER — HEPARIN SODIUM (PORCINE) 1000 UNIT/ML IJ SOLN
INTRAMUSCULAR | Status: DC
  Filled 2017-06-01: qty 30

## 2017-06-01 MED ORDER — POTASSIUM CHLORIDE 2 MEQ/ML IV SOLN
80.0000 meq | INTRAVENOUS | Status: DC
  Filled 2017-06-01: qty 40

## 2017-06-01 MED ORDER — DEXTROSE 5 % IV SOLN
0.0000 ug/min | INTRAVENOUS | Status: AC
  Administered 2017-06-03: 1 ug/min via INTRAVENOUS
  Filled 2017-06-01: qty 4

## 2017-06-01 MED ORDER — VANCOMYCIN HCL 10 G IV SOLR
1500.0000 mg | INTRAVENOUS | Status: AC
  Administered 2017-06-03: 1500 mg via INTRAVENOUS
  Filled 2017-06-01: qty 1500

## 2017-06-01 MED ORDER — MAGNESIUM SULFATE 50 % IJ SOLN
40.0000 meq | INTRAMUSCULAR | Status: DC
  Filled 2017-06-01: qty 9.85

## 2017-06-01 MED ORDER — SODIUM CHLORIDE 0.9 % IV SOLN
1.5000 g | INTRAVENOUS | Status: AC
  Administered 2017-06-03: 1.5 g via INTRAVENOUS
  Filled 2017-06-01 (×2): qty 1.5

## 2017-06-01 MED ORDER — NITROGLYCERIN IN D5W 200-5 MCG/ML-% IV SOLN
2.0000 ug/min | INTRAVENOUS | Status: DC
  Filled 2017-06-01: qty 250

## 2017-06-01 MED ORDER — DEXTROSE 5 % IV SOLN
0.0000 ug/min | INTRAVENOUS | Status: DC
  Filled 2017-06-01: qty 4

## 2017-06-02 NOTE — Anesthesia Preprocedure Evaluation (Addendum)
Anesthesia Evaluation  Patient identified by MRN, date of birth, ID band Patient awake    Reviewed: Allergy & Precautions, H&P , NPO status , Patient's Chart, lab work & pertinent test results  Airway Mallampati: II  TM Distance: >3 FB Neck ROM: Full    Dental no notable dental hx. (+) Teeth Intact, Dental Advisory Given   Pulmonary shortness of breath and with exertion,    Pulmonary exam normal breath sounds clear to auscultation       Cardiovascular Exercise Tolerance: Good hypertension, + DOE  + Valvular Problems/Murmurs AS  Rhythm:Regular Rate:Normal + Systolic murmurs    Neuro/Psych Anxiety negative neurological ROS     GI/Hepatic negative GI ROS, Neg liver ROS,   Endo/Other  negative endocrine ROS  Renal/GU negative Renal ROS  negative genitourinary   Musculoskeletal  (+) Arthritis , Osteoarthritis,    Abdominal   Peds  Hematology negative hematology ROS (+)   Anesthesia Other Findings   Reproductive/Obstetrics negative OB ROS                            Anesthesia Physical Anesthesia Plan  ASA: IV  Anesthesia Plan: MAC   Post-op Pain Management:    Induction: Intravenous  PONV Risk Score and Plan: 2 and Ondansetron, Propofol infusion and Treatment may vary due to age or medical condition  Airway Management Planned: Simple Face Mask  Additional Equipment: Arterial line, CVP and Ultrasound Guidance Line Placement  Intra-op Plan:   Post-operative Plan:   Informed Consent: I have reviewed the patients History and Physical, chart, labs and discussed the procedure including the risks, benefits and alternatives for the proposed anesthesia with the patient or authorized representative who has indicated his/her understanding and acceptance.   Dental advisory given  Plan Discussed with: CRNA  Anesthesia Plan Comments:         Anesthesia Quick Evaluation

## 2017-06-03 ENCOUNTER — Inpatient Hospital Stay (HOSPITAL_COMMUNITY)
Admission: RE | Disposition: A | Discharge status: Home / Self Care | Payer: Medicare PPO | Source: Home / Self Care | Attending: Thoracic Surgery (Cardiothoracic Vascular Surgery)

## 2017-06-03 ENCOUNTER — Other Ambulatory Visit: Payer: Self-pay

## 2017-06-03 ENCOUNTER — Encounter (HOSPITAL_COMMUNITY): Payer: Self-pay | Admitting: *Deleted

## 2017-06-03 ENCOUNTER — Inpatient Hospital Stay (HOSPITAL_COMMUNITY): Payer: Medicare PPO | Admitting: Vascular Surgery

## 2017-06-03 ENCOUNTER — Inpatient Hospital Stay (HOSPITAL_COMMUNITY): Payer: Medicare PPO

## 2017-06-03 ENCOUNTER — Inpatient Hospital Stay (HOSPITAL_COMMUNITY): Payer: Medicare PPO | Admitting: Anesthesiology

## 2017-06-03 ENCOUNTER — Ambulatory Visit (HOSPITAL_COMMUNITY): Payer: Medicare PPO

## 2017-06-03 ENCOUNTER — Inpatient Hospital Stay (HOSPITAL_COMMUNITY)
Admission: RE | Admit: 2017-06-03 | Discharge: 2017-06-05 | DRG: 267 | Disposition: A | Discharge status: Home / Self Care | Payer: Medicare PPO | Attending: Thoracic Surgery (Cardiothoracic Vascular Surgery) | Admitting: Thoracic Surgery (Cardiothoracic Vascular Surgery)

## 2017-06-03 DIAGNOSIS — M5136 Other intervertebral disc degeneration, lumbar region: Secondary | ICD-10-CM | POA: Diagnosis present

## 2017-06-03 DIAGNOSIS — J811 Chronic pulmonary edema: Secondary | ICD-10-CM | POA: Diagnosis present

## 2017-06-03 DIAGNOSIS — D519 Vitamin B12 deficiency anemia, unspecified: Secondary | ICD-10-CM | POA: Diagnosis present

## 2017-06-03 DIAGNOSIS — R011 Cardiac murmur, unspecified: Secondary | ICD-10-CM | POA: Diagnosis present

## 2017-06-03 DIAGNOSIS — Z7982 Long term (current) use of aspirin: Secondary | ICD-10-CM

## 2017-06-03 DIAGNOSIS — I2510 Atherosclerotic heart disease of native coronary artery without angina pectoris: Secondary | ICD-10-CM | POA: Diagnosis present

## 2017-06-03 DIAGNOSIS — M19012 Primary osteoarthritis, left shoulder: Secondary | ICD-10-CM | POA: Diagnosis present

## 2017-06-03 DIAGNOSIS — Z823 Family history of stroke: Secondary | ICD-10-CM | POA: Diagnosis not present

## 2017-06-03 DIAGNOSIS — N183 Chronic kidney disease, stage 3 (moderate): Secondary | ICD-10-CM | POA: Diagnosis present

## 2017-06-03 DIAGNOSIS — Z8249 Family history of ischemic heart disease and other diseases of the circulatory system: Secondary | ICD-10-CM

## 2017-06-03 DIAGNOSIS — M545 Low back pain: Secondary | ICD-10-CM | POA: Diagnosis present

## 2017-06-03 DIAGNOSIS — Z006 Encounter for examination for normal comparison and control in clinical research program: Secondary | ICD-10-CM

## 2017-06-03 DIAGNOSIS — I959 Hypotension, unspecified: Secondary | ICD-10-CM | POA: Diagnosis not present

## 2017-06-03 DIAGNOSIS — J849 Interstitial pulmonary disease, unspecified: Secondary | ICD-10-CM | POA: Diagnosis present

## 2017-06-03 DIAGNOSIS — G8929 Other chronic pain: Secondary | ICD-10-CM | POA: Diagnosis present

## 2017-06-03 DIAGNOSIS — D869 Sarcoidosis, unspecified: Secondary | ICD-10-CM | POA: Diagnosis present

## 2017-06-03 DIAGNOSIS — I350 Nonrheumatic aortic (valve) stenosis: Principal | ICD-10-CM | POA: Diagnosis present

## 2017-06-03 DIAGNOSIS — D696 Thrombocytopenia, unspecified: Secondary | ICD-10-CM | POA: Diagnosis present

## 2017-06-03 DIAGNOSIS — Z952 Presence of prosthetic heart valve: Secondary | ICD-10-CM

## 2017-06-03 DIAGNOSIS — G4700 Insomnia, unspecified: Secondary | ICD-10-CM | POA: Diagnosis present

## 2017-06-03 DIAGNOSIS — F419 Anxiety disorder, unspecified: Secondary | ICD-10-CM | POA: Diagnosis present

## 2017-06-03 DIAGNOSIS — Z803 Family history of malignant neoplasm of breast: Secondary | ICD-10-CM | POA: Diagnosis not present

## 2017-06-03 DIAGNOSIS — I10 Essential (primary) hypertension: Secondary | ICD-10-CM | POA: Diagnosis present

## 2017-06-03 DIAGNOSIS — Z954 Presence of other heart-valve replacement: Secondary | ICD-10-CM | POA: Diagnosis not present

## 2017-06-03 DIAGNOSIS — G250 Essential tremor: Secondary | ICD-10-CM | POA: Diagnosis present

## 2017-06-03 DIAGNOSIS — E785 Hyperlipidemia, unspecified: Secondary | ICD-10-CM | POA: Diagnosis present

## 2017-06-03 DIAGNOSIS — Z79899 Other long term (current) drug therapy: Secondary | ICD-10-CM | POA: Diagnosis not present

## 2017-06-03 DIAGNOSIS — I361 Nonrheumatic tricuspid (valve) insufficiency: Secondary | ICD-10-CM | POA: Diagnosis not present

## 2017-06-03 DIAGNOSIS — I129 Hypertensive chronic kidney disease with stage 1 through stage 4 chronic kidney disease, or unspecified chronic kidney disease: Secondary | ICD-10-CM | POA: Diagnosis present

## 2017-06-03 DIAGNOSIS — N189 Chronic kidney disease, unspecified: Secondary | ICD-10-CM | POA: Diagnosis present

## 2017-06-03 HISTORY — PX: INTRAOPERATIVE TRANSTHORACIC ECHOCARDIOGRAM: SHX6523

## 2017-06-03 HISTORY — PX: TRANSCATHETER AORTIC VALVE REPLACEMENT, TRANSFEMORAL: SHX6400

## 2017-06-03 HISTORY — DX: Presence of prosthetic heart valve: Z95.2

## 2017-06-03 HISTORY — DX: Essential tremor: G25.0

## 2017-06-03 HISTORY — DX: Chronic kidney disease, unspecified: N18.9

## 2017-06-03 LAB — POCT I-STAT, CHEM 8
BUN: 10 mg/dL (ref 6–20)
CALCIUM ION: 1.23 mmol/L (ref 1.15–1.40)
Chloride: 102 mmol/L (ref 101–111)
Creatinine, Ser: 1.5 mg/dL — ABNORMAL HIGH (ref 0.61–1.24)
Glucose, Bld: 95 mg/dL (ref 65–99)
HCT: 30 % — ABNORMAL LOW (ref 39.0–52.0)
Hemoglobin: 10.2 g/dL — ABNORMAL LOW (ref 13.0–17.0)
Potassium: 4.1 mmol/L (ref 3.5–5.1)
SODIUM: 142 mmol/L (ref 135–145)
TCO2: 25 mmol/L (ref 22–32)

## 2017-06-03 LAB — PROTIME-INR
INR: 1.26
Prothrombin Time: 15.6 seconds — ABNORMAL HIGH (ref 11.4–15.2)

## 2017-06-03 LAB — POCT I-STAT 4, (NA,K, GLUC, HGB,HCT)
Glucose, Bld: 126 mg/dL — ABNORMAL HIGH (ref 65–99)
HCT: 26 % — ABNORMAL LOW (ref 39.0–52.0)
Hemoglobin: 8.8 g/dL — ABNORMAL LOW (ref 13.0–17.0)
POTASSIUM: 4.2 mmol/L (ref 3.5–5.1)
Sodium: 140 mmol/L (ref 135–145)

## 2017-06-03 LAB — APTT: aPTT: 38 seconds — ABNORMAL HIGH (ref 24–36)

## 2017-06-03 SURGERY — IMPLANTATION, AORTIC VALVE, TRANSCATHETER, FEMORAL APPROACH
Anesthesia: Monitor Anesthesia Care | Site: Chest

## 2017-06-03 MED ORDER — ONDANSETRON HCL 4 MG/2ML IJ SOLN
INTRAMUSCULAR | Status: AC
  Filled 2017-06-03: qty 2

## 2017-06-03 MED ORDER — OXYCODONE HCL 5 MG PO TABS: 5.0000 mg | ORAL_TABLET | ORAL | Status: DC | PRN

## 2017-06-03 MED ORDER — SODIUM CHLORIDE 0.9 % IV SOLN
INTRAVENOUS | Status: DC | PRN
  Administered 2017-06-03: 1500 mL

## 2017-06-03 MED ORDER — LACTATED RINGERS IV SOLN
INTRAVENOUS | Status: DC
  Administered 2017-06-03: 09:00:00 via INTRAVENOUS

## 2017-06-03 MED ORDER — SODIUM CHLORIDE 0.9% FLUSH
10.0000 mL | Freq: Two times a day (BID) | INTRAVENOUS | Status: DC
  Administered 2017-06-03: 10 mL

## 2017-06-03 MED ORDER — ONDANSETRON HCL 4 MG/2ML IJ SOLN
INTRAMUSCULAR | Status: DC | PRN
  Administered 2017-06-03: 4 mg via INTRAVENOUS

## 2017-06-03 MED ORDER — PROTAMINE SULFATE 10 MG/ML IV SOLN
INTRAVENOUS | Status: AC
  Filled 2017-06-03: qty 25

## 2017-06-03 MED ORDER — FENTANYL CITRATE (PF) 100 MCG/2ML IJ SOLN
INTRAMUSCULAR | Status: AC
  Filled 2017-06-03: qty 2

## 2017-06-03 MED ORDER — MIDAZOLAM HCL 2 MG/2ML IJ SOLN
1.0000 mg | Freq: Once | INTRAMUSCULAR | Status: AC
  Administered 2017-06-03: 1 mg via INTRAVENOUS

## 2017-06-03 MED ORDER — SIMVASTATIN 40 MG PO TABS
40.0000 mg | ORAL_TABLET | Freq: Every day | ORAL | Status: DC
  Administered 2017-06-03 – 2017-06-04 (×2): 40 mg via ORAL
  Filled 2017-06-03 (×2): qty 1

## 2017-06-03 MED ORDER — ONDANSETRON HCL 4 MG/2ML IJ SOLN: 4.0000 mg | Freq: Four times a day (QID) | INTRAMUSCULAR | Status: DC | PRN

## 2017-06-03 MED ORDER — SODIUM CHLORIDE 0.9 % IV SOLN
INTRAVENOUS | Status: AC
  Filled 2017-06-03: qty 1.2

## 2017-06-03 MED ORDER — METOPROLOL TARTRATE 5 MG/5ML IV SOLN: 2.5000 mg | INTRAVENOUS | Status: DC | PRN

## 2017-06-03 MED ORDER — VANCOMYCIN HCL IN DEXTROSE 1-5 GM/200ML-% IV SOLN
1000.0000 mg | Freq: Once | INTRAVENOUS | Status: AC
  Administered 2017-06-03: 1000 mg via INTRAVENOUS
  Filled 2017-06-03: qty 200

## 2017-06-03 MED ORDER — PROTAMINE SULFATE 10 MG/ML IV SOLN
INTRAVENOUS | Status: DC | PRN
  Administered 2017-06-03 (×2): 30 mg via INTRAVENOUS
  Administered 2017-06-03: 20 mg via INTRAVENOUS
  Administered 2017-06-03 (×2): 30 mg via INTRAVENOUS

## 2017-06-03 MED ORDER — MIDAZOLAM HCL 2 MG/2ML IJ SOLN
INTRAMUSCULAR | Status: AC
  Filled 2017-06-03: qty 2

## 2017-06-03 MED ORDER — HEPARIN SODIUM (PORCINE) 1000 UNIT/ML IJ SOLN
INTRAMUSCULAR | Status: AC
  Filled 2017-06-03: qty 1

## 2017-06-03 MED ORDER — ACETAMINOPHEN 160 MG/5ML PO SOLN: 1000.0000 mg | Freq: Four times a day (QID) | ORAL | Status: DC

## 2017-06-03 MED ORDER — ASPIRIN EC 81 MG PO TBEC
81.0000 mg | DELAYED_RELEASE_TABLET | Freq: Every day | ORAL | Status: DC
  Administered 2017-06-04 – 2017-06-05 (×2): 81 mg via ORAL
  Filled 2017-06-03 (×2): qty 1

## 2017-06-03 MED ORDER — SODIUM CHLORIDE 0.9 % IV SOLN
INTRAVENOUS | Status: DC
  Administered 2017-06-03 – 2017-06-04 (×2): via INTRAVENOUS

## 2017-06-03 MED ORDER — CHLORHEXIDINE GLUCONATE 0.12 % MT SOLN
15.0000 mL | Freq: Once | OROMUCOSAL | Status: AC
  Administered 2017-06-03: 15 mL via OROMUCOSAL
  Filled 2017-06-03: qty 15

## 2017-06-03 MED ORDER — IODIXANOL 320 MG/ML IV SOLN
INTRAVENOUS | Status: DC | PRN
  Administered 2017-06-03: 60.4 mL via INTRAVENOUS

## 2017-06-03 MED ORDER — SODIUM CHLORIDE 0.9% FLUSH: 10.0000 mL | INTRAVENOUS | Status: DC | PRN

## 2017-06-03 MED ORDER — HEPARIN SODIUM (PORCINE) 1000 UNIT/ML IJ SOLN
INTRAMUSCULAR | Status: DC | PRN
  Administered 2017-06-03: 14000 [IU] via INTRAVENOUS

## 2017-06-03 MED ORDER — LACTATED RINGERS IV SOLN
500.0000 mL | Freq: Once | INTRAVENOUS | Status: AC | PRN
  Administered 2017-06-03: 500 mL via INTRAVENOUS

## 2017-06-03 MED ORDER — SODIUM CHLORIDE 0.9 % IV SOLN: INTRAVENOUS | Status: DC

## 2017-06-03 MED ORDER — CEFUROXIME SODIUM 1.5 G IV SOLR
1.5000 g | Freq: Two times a day (BID) | INTRAVENOUS | Status: AC
  Administered 2017-06-03 – 2017-06-05 (×4): 1.5 g via INTRAVENOUS
  Filled 2017-06-03 (×5): qty 1.5

## 2017-06-03 MED ORDER — MORPHINE SULFATE (PF) 2 MG/ML IV SOLN: 2.0000 mg | INTRAVENOUS | Status: DC | PRN

## 2017-06-03 MED ORDER — MIDAZOLAM HCL 2 MG/2ML IJ SOLN: 2.0000 mg | INTRAMUSCULAR | Status: DC | PRN

## 2017-06-03 MED ORDER — SODIUM CHLORIDE 0.9 % IV SOLN
INTRAVENOUS | Status: AC
  Filled 2017-06-03 (×2): qty 1.2

## 2017-06-03 MED ORDER — CHLORHEXIDINE GLUCONATE 4 % EX LIQD: 60.0000 mL | Freq: Once | CUTANEOUS | Status: DC

## 2017-06-03 MED ORDER — LACTATED RINGERS IV SOLN
INTRAVENOUS | Status: DC | PRN
Start: 2017-06-03 — End: 2017-06-03
  Administered 2017-06-03: 10:00:00 via INTRAVENOUS

## 2017-06-03 MED ORDER — ASPIRIN 81 MG PO CHEW: 81.0000 mg | CHEWABLE_TABLET | Freq: Every day | ORAL | Status: DC

## 2017-06-03 MED ORDER — ALBUMIN HUMAN 5 % IV SOLN: 250.0000 mL | INTRAVENOUS | Status: DC | PRN

## 2017-06-03 MED ORDER — CHLORHEXIDINE GLUCONATE 4 % EX LIQD: 30.0000 mL | CUTANEOUS | Status: DC

## 2017-06-03 MED ORDER — SODIUM CHLORIDE 0.9 % IV SOLN
0.0000 ug/min | INTRAVENOUS | Status: DC
  Filled 2017-06-03: qty 2

## 2017-06-03 MED ORDER — CLOPIDOGREL BISULFATE 75 MG PO TABS
75.0000 mg | ORAL_TABLET | Freq: Every day | ORAL | Status: DC
  Administered 2017-06-04 – 2017-06-05 (×2): 75 mg via ORAL
  Filled 2017-06-03 (×2): qty 1

## 2017-06-03 MED ORDER — NITROGLYCERIN IN D5W 200-5 MCG/ML-% IV SOLN: 0.0000 ug/min | INTRAVENOUS | Status: DC

## 2017-06-03 MED ORDER — TRAZODONE HCL 100 MG PO TABS
100.0000 mg | ORAL_TABLET | Freq: Every day | ORAL | Status: DC
  Administered 2017-06-03: 100 mg via ORAL
  Filled 2017-06-03: qty 1

## 2017-06-03 MED ORDER — PROPOFOL 500 MG/50ML IV EMUL
INTRAVENOUS | Status: DC | PRN
  Administered 2017-06-03: 25 ug/kg/min via INTRAVENOUS

## 2017-06-03 MED ORDER — ACETAMINOPHEN 500 MG PO TABS
1000.0000 mg | ORAL_TABLET | Freq: Four times a day (QID) | ORAL | Status: DC
  Administered 2017-06-03 – 2017-06-04 (×4): 1000 mg via ORAL
  Filled 2017-06-03 (×4): qty 2

## 2017-06-03 MED ORDER — PANTOPRAZOLE SODIUM 40 MG PO TBEC
40.0000 mg | DELAYED_RELEASE_TABLET | Freq: Every day | ORAL | Status: DC
  Administered 2017-06-04 – 2017-06-05 (×2): 40 mg via ORAL
  Filled 2017-06-03 (×2): qty 1

## 2017-06-03 MED ORDER — CHLORHEXIDINE GLUCONATE CLOTH 2 % EX PADS
6.0000 | MEDICATED_PAD | Freq: Every day | CUTANEOUS | Status: DC
  Administered 2017-06-03 – 2017-06-04 (×2): 6 via TOPICAL

## 2017-06-03 MED ORDER — LIDOCAINE HCL 1 % IJ SOLN
INTRAMUSCULAR | Status: DC | PRN
  Administered 2017-06-03: 8 mL

## 2017-06-03 MED ORDER — CLONAZEPAM 0.5 MG PO TABS: 0.5000 mg | ORAL_TABLET | Freq: Two times a day (BID) | ORAL | Status: DC | PRN

## 2017-06-03 MED ORDER — TRAMADOL HCL 50 MG PO TABS: 50.0000 mg | ORAL_TABLET | ORAL | Status: DC | PRN

## 2017-06-03 MED ORDER — FENTANYL CITRATE (PF) 100 MCG/2ML IJ SOLN
50.0000 ug | Freq: Once | INTRAMUSCULAR | Status: AC
  Administered 2017-06-03: 50 ug via INTRAVENOUS

## 2017-06-03 MED ORDER — LIDOCAINE HCL (PF) 1 % IJ SOLN
INTRAMUSCULAR | Status: AC
  Filled 2017-06-03: qty 30

## 2017-06-03 SURGICAL SUPPLY — 59 items
BAG DECANTER FOR FLEXI CONT (MISCELLANEOUS) ×3 IMPLANT
BAG SNAP BAND KOVER 36X36 (MISCELLANEOUS) ×3 IMPLANT
BLADE CLIPPER SURG (BLADE) ×3 IMPLANT
BLADE STERNUM SYSTEM 6 (BLADE) ×3 IMPLANT
CABLE ADAPT CONN TEMP 6FT (ADAPTER) ×3 IMPLANT
CATH DIAG EXPO 6F VENT PIG 145 (CATHETERS) ×6 IMPLANT
CATH INFINITI 6F AL2 (CATHETERS) ×3 IMPLANT
CATH S G BIP PACING (SET/KITS/TRAYS/PACK) ×3 IMPLANT
CONT SPEC 4OZ CLIKSEAL STRL BL (MISCELLANEOUS) ×6 IMPLANT
COVER BACK TABLE 24X17X13 BIG (DRAPES) IMPLANT
COVER BACK TABLE 80X110 HD (DRAPES) ×6 IMPLANT
COVER DOME SNAP 22 D (MISCELLANEOUS) IMPLANT
CRADLE DONUT ADULT HEAD (MISCELLANEOUS) ×3 IMPLANT
DERMABOND ADHESIVE PROPEN (GAUZE/BANDAGES/DRESSINGS) ×1
DERMABOND ADVANCED (GAUZE/BANDAGES/DRESSINGS) ×1
DERMABOND ADVANCED .7 DNX12 (GAUZE/BANDAGES/DRESSINGS) ×2 IMPLANT
DERMABOND ADVANCED .7 DNX6 (GAUZE/BANDAGES/DRESSINGS) ×2 IMPLANT
DEVICE CLOSURE PERCLS PRGLD 6F (VASCULAR PRODUCTS) ×4 IMPLANT
DRAPE INCISE IOBAN 66X45 STRL (DRAPES) ×3 IMPLANT
DRSG TEGADERM 4X4.75 (GAUZE/BANDAGES/DRESSINGS) ×6 IMPLANT
ELECT REM PT RETURN 9FT ADLT (ELECTROSURGICAL) ×6
ELECTRODE REM PT RTRN 9FT ADLT (ELECTROSURGICAL) ×4 IMPLANT
GAUZE SPONGE 4X4 12PLY STRL (GAUZE/BANDAGES/DRESSINGS) ×3 IMPLANT
GLOVE BIO SURGEON STRL SZ7.5 (GLOVE) ×3 IMPLANT
GLOVE BIO SURGEON STRL SZ8 (GLOVE) ×6 IMPLANT
GLOVE ORTHO TXT STRL SZ7.5 (GLOVE) ×6 IMPLANT
GOWN STRL REUS W/ TWL LRG LVL3 (GOWN DISPOSABLE) ×8 IMPLANT
GOWN STRL REUS W/ TWL XL LVL3 (GOWN DISPOSABLE) ×4 IMPLANT
GOWN STRL REUS W/TWL LRG LVL3 (GOWN DISPOSABLE) ×4
GOWN STRL REUS W/TWL XL LVL3 (GOWN DISPOSABLE) ×2
GUIDEWIRE SAFE TJ AMPLATZ EXST (WIRE) ×3 IMPLANT
GUIDEWIRE STRAIGHT .035 260CM (WIRE) ×3 IMPLANT
KIT BASIN OR (CUSTOM PROCEDURE TRAY) ×3 IMPLANT
KIT DILATOR VASC 18G NDL (KITS) IMPLANT
KIT HEART LEFT (KITS) ×3 IMPLANT
KIT TURNOVER KIT B (KITS) ×3 IMPLANT
NEEDLE 22X1 1/2 (OR ONLY) (NEEDLE) IMPLANT
NEEDLE PERC 18GX7CM (NEEDLE) ×3 IMPLANT
NS IRRIG 1000ML POUR BTL (IV SOLUTION) ×9 IMPLANT
PACK ENDOVASCULAR (PACKS) ×3 IMPLANT
PAD ARMBOARD 7.5X6 YLW CONV (MISCELLANEOUS) ×6 IMPLANT
PAD ELECT DEFIB RADIOL ZOLL (MISCELLANEOUS) ×3 IMPLANT
PENCIL BUTTON HOLSTER BLD 10FT (ELECTRODE) ×3 IMPLANT
PERCLOSE PROGLIDE 6F (VASCULAR PRODUCTS) ×6
SET MICROPUNCTURE 5F STIFF (MISCELLANEOUS) ×3 IMPLANT
SHEATH BRITE TIP 6FR 35CM (SHEATH) ×6 IMPLANT
SHEATH PINNACLE 6F 10CM (SHEATH) ×3 IMPLANT
SHEATH PINNACLE 8F 10CM (SHEATH) ×3 IMPLANT
SLEEVE REPOSITIONING LENGTH 30 (MISCELLANEOUS) ×3 IMPLANT
STOPCOCK MORSE 400PSI 3WAY (MISCELLANEOUS) ×6 IMPLANT
SUT SILK  1 MH (SUTURE) ×1
SUT SILK 1 MH (SUTURE) ×2 IMPLANT
SYR 50ML LL SCALE MARK (SYRINGE) ×3 IMPLANT
SYR CONTROL 10ML LL (SYRINGE) ×3 IMPLANT
TAPE CLOTH SURG 6X10 WHT LF (GAUZE/BANDAGES/DRESSINGS) ×3 IMPLANT
TOWEL GREEN STERILE (TOWEL DISPOSABLE) ×6 IMPLANT
TRANSDUCER W/STOPCOCK (MISCELLANEOUS) ×6 IMPLANT
VALVE HEART TRANSCATH SZ3 29MM (Prosthesis & Implant Heart) ×3 IMPLANT
WIRE .035 3MM-J 145CM (WIRE) ×3 IMPLANT

## 2017-06-03 NOTE — Progress Notes (Signed)
  [facility]   MULTIDISCIPLINARY HEART VALVE TEAM  Patient doing well s/p TAVR. He is hemodynamically stable. Groin sites stable. Tele with sinus bradycardia, but no high grade block. Plan to DC arterial line. Early ambulation and plan to transfer to tele tomorrow.   Cline Crock PA-C  [facility]  Pager [phone]

## 2017-06-03 NOTE — Interval H&P Note (Signed)
History and Physical Interval Note:  06/03/2017 9:27 AM  Jason Anthony  has presented today for surgery, with the diagnosis of Severe Aortic Stenosis  The various methods of treatment have been discussed with the patient and family. After consideration of risks, benefits and other options for treatment, the patient has consented to  Procedure(s): TRANSCATHETER AORTIC VALVE REPLACEMENT, TRANSFEMORAL (N/A) TRANSESOPHAGEAL ECHOCARDIOGRAM (TEE) (N/A) as a surgical intervention .  The patient's history has been reviewed, patient examined, no change in status, stable for surgery.  I have reviewed the patient's chart and labs.  Questions were answered to the patient's satisfaction.     Purcell Nails

## 2017-06-03 NOTE — Anesthesia Procedure Notes (Signed)
Central Venous Catheter Insertion Performed by: Gaynelle Adu, MD, anesthesiologist Start/End5/28/2019 9:00 AM, 06/03/2017 9:10 AM Patient location: Pre-op. Preanesthetic checklist: patient identified, IV checked, site marked, risks and benefits discussed, surgical consent, monitors and equipment checked, pre-op evaluation, timeout performed and anesthesia consent Position: Trendelenburg Lidocaine 1% used for infiltration and patient sedated Hand hygiene performed , maximum sterile barriers used  and Seldinger technique used Catheter size: 8 Fr Total catheter length 16. Central line was placed.Double lumen Procedure performed using ultrasound guided technique. Ultrasound Notes:anatomy identified, needle tip was noted to be adjacent to the nerve/plexus identified, no ultrasound evidence of intravascular and/or intraneural injection and image(s) printed for medical record Attempts: 1 Following insertion, dressing applied, line sutured and Biopatch. Post procedure assessment: blood return through all ports  Patient tolerated the procedure well with no immediate complications.

## 2017-06-03 NOTE — CV Procedure (Signed)
[facility]  TAVR OPERATIVE NOTE   Date of Procedure:  06/03/2017  Preoperative Diagnosis: Severe Aortic Stenosis   Postoperative Diagnosis: Same   Procedure:    Transcatheter Aortic Valve Replacement - Transfemoral Approach  Edwards Sapien 3 THV (size 29 mm, model # [id number], serial # [id number])   Co-Surgeons:  Lauree Chandler, MD and Valentina Gu. Roxy Manns, MD   Anesthesiologist:  Fitzgerald,E  Echocardiographer:  Johnsie Cancel  Pre-operative Echo Findings:  Severe aortic stenosis  Normal left ventricular systolic function  Post-operative Echo Findings:  No paravalvular leak  Normal left ventricular systolic function  BRIEF CLINICAL NOTE AND INDICATIONS FOR SURGERY   Jason Anthony is a 72 yo male with history of pulm sarcoidosis, HTN, HLD, CKD and severe aortic stenosis with recent worsening of his AS. Cardiac cath with severe distal LAD stenosis, moderate proximal and mid LAD stenosis, mild non-obstructive disease in the RCA and Circumflex. We elected for medical management of his CAD. Echo with normal LV systolic function, severe AS, mean gradient 49 mmHg. He is here today for TAVR. He notes ongoing dizziness and dyspnea.   During the course of the patient's preoperative work up they have been evaluated comprehensively by a multidisciplinary team of specialists coordinated through the [facility] in the [facility].  They have been demonstrated to suffer from symptomatic severe aortic stenosis as noted above. The patient has been counseled extensively as to the relative risks and benefits of all options for the treatment of severe aortic stenosis including long term medical therapy, conventional surgery for aortic valve replacement, and transcatheter aortic valve replacement.  The patient has been independently evaluated by two cardiac surgeons including Dr Roxy Manns and Dr. Cyndia Bent, and they are felt to be at high risk for  conventional surgical aortic valve replacement. Both surgeons indicated the patient would be a poor candidate for conventional surgery. Based upon review of all of the patient's preoperative diagnostic tests they are felt to be candidate for transcatheter aortic valve replacement using the transfemoral approach as an alternative to high risk conventional surgery.    Following the decision to proceed with transcatheter aortic valve replacement, a discussion has been held regarding what types of management strategies would be attempted intraoperatively in the event of life-threatening complications, including whether or not the patient would be considered a candidate for the use of cardiopulmonary bypass and/or conversion to open sternotomy for attempted surgical intervention.  The patient has been advised of a variety of complications that might develop peculiar to this approach including but not limited to risks of death, stroke, paravalvular leak, aortic dissection or other major vascular complications, aortic annulus rupture, device embolization, cardiac rupture or perforation, acute myocardial infarction, arrhythmia, heart block or bradycardia requiring permanent pacemaker placement, congestive heart failure, respiratory failure, renal failure, pneumonia, infection, other late complications related to structural valve deterioration or migration, or other complications that might ultimately cause a temporary or permanent loss of functional independence or other long term morbidity.  The patient provides full informed consent for the procedure as described and all questions were answered preoperatively.    DETAILS OF THE OPERATIVE PROCEDURE  PREPARATION:   The patient is brought to the operating room on the above mentioned date and central monitoring was established by the anesthesia team including placement of a radial arterial line. The patient is placed in the supine position on the operating table.   Intravenous antibiotics are administered. Conscious sedation is used.  Baseline transthoracic echocardiogram was performed. The patient's chest, abdomen, both groins, and both lower extremities are prepared and draped in a sterile manner. A time out procedure is performed.   PERIPHERAL ACCESS:   Using the modified Seldinger technique, femoral arterial and venous access were obtained with placement of 6 Fr sheaths on the left side using u/s guidance.  A pigtail diagnostic catheter was passed through the femoral arterial sheath under fluoroscopic guidance into the aortic root.  A temporary transvenous pacemaker catheter was passed through the femoral venous sheath under fluoroscopic guidance into the right ventricle.  The pacemaker was tested to ensure stable lead placement and pacemaker capture. Aortic root angiography was performed in order to determine the optimal angiographic angle for valve deployment.  TRANSFEMORAL ACCESS:  A micropuncture kit was used to gain access to the right femoral artery using u/s guidance. Pre-closure with double ProGlide closure devices. The patient was heparinized systemically and ACT verified > 250 seconds.    A 16 Fr transfemoral E-sheath was introduced into the right femoral artery after progressively dilating over an Amplatz superstiff wire. An AL-2 catheter was used to direct a straight-tip exchange length wire across the native aortic valve into the left ventricle. This was exchanged out for a pigtail catheter and position was confirmed in the LV apex. Simultaneous LV and Ao pressures were recorded.  The pigtail catheter was then exchanged for an Amplatz Extra-stiff wire in the LV apex.   TRANSCATHETER HEART VALVE DEPLOYMENT:  An Edwards Sapien 3 THV (size 29 mm) was prepared and crimped per manufacturer's guidelines, and the proper orientation of the valve is confirmed on the [employer] delivery system. The valve was advanced through the introducer sheath  using normal technique until in an appropriate position in the abdominal aorta beyond the sheath tip. The balloon was then retracted and using the fine-tuning wheel was centered on the valve. The valve was then advanced across the aortic arch using appropriate flexion of the catheter. The valve was carefully positioned across the aortic valve annulus. The Commander catheter was retracted using normal technique. Once final position of the valve has been confirmed by angiographic assessment, the valve is deployed while temporarily holding ventilation and during rapid ventricular pacing to maintain systolic blood pressure < 50 mmHg and pulse pressure < 10 mmHg. The balloon inflation is held for >3 seconds after reaching full deployment volume. Once the balloon has fully deflated the balloon is retracted into the ascending aorta and valve function is assessed using TTE. There is felt to be no paravalvular leak and no central aortic insufficiency.  The patient's hemodynamic recovery following valve deployment is good.  The deployment balloon and guidewire are both removed. Echo demostrated acceptable post-procedural gradients, stable mitral valve function, and no AI.   PROCEDURE COMPLETION:  The sheath was then removed and closure devices were completed. Protamine was administered once femoral arterial repair was complete. The temporary pacemaker, pigtail catheters and femoral sheaths were removed with manual pressure used for hemostasis.   The patient tolerated the procedure well and is transported to the surgical intensive care in stable condition. There were no immediate intraoperative complications. All sponge instrument and needle counts are verified correct at completion of the operation.   No blood products were administered during the operation.  The patient received a total of 60 mL of intravenous contrast during the procedure.  Lauree Chandler MD 06/03/2017 11:27 AM

## 2017-06-03 NOTE — Progress Notes (Signed)
CT surgery p.m. Rounds  Doing well after TAVR Sinus rhythm Ambulated in hallway Voided without difficulty No bleeding at groin site

## 2017-06-03 NOTE — Progress Notes (Signed)
Arterial and venous sheaths removed and pressure held for 20 minutes. Patient remained hemodynamically stable throughout. There is no hematoma present. Bedrest started at 1155.

## 2017-06-03 NOTE — Op Note (Signed)
[facility]   MULTIDISCIPLINARY HEART VALVE TEAM   TAVR OPERATIVE NOTE   Date of Procedure:  06/03/2017  Preoperative Diagnosis: Severe Aortic Stenosis   Postoperative Diagnosis: Same   Procedure:    Transcatheter Aortic Valve Replacement - Percutaneous Right Transfemoral Approach  Edwards Sapien 3 THV (size 29 mm, model # [id number], serial # [id number])   Co-Surgeons:  Salvatore Decent. 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  The patient states that he was first noted to have a heart murmur several years ago by his primary care physician.  He was referred to a cardiologist in [place] and was diagnosed with moderate aortic stenosis.   Echocardiogram performed at [facility] in [place], [place] on May 06, 2016 revealed severe aortic stenosis with normal left ventricular systolic function.  Peak velocity across the aortic valve was reported 3.9 m/s corresponding to mean transvalvular gradient estimated 36 mmHg and aortic valve area calculated 0.61 cm.  He was told that he might need to have surgical intervention eventually but medical follow-up was recommended.  Shortly after that the patient was self-referred to Dr. Eden Emms who happens to take care of a close friend  of the patient's.  A nuclear stress test was performed that revealed resting ejection fraction calculated 51% with nuclear imaging felt to be low risk for myocardial ischemia.  His echocardiogram from [facility] was reviewed and close follow-up was recommended.  Recent follow-up echocardiogram performed April 23, 2017 revealed significant progression and severity of aortic stenosis.  Peak velocity across aortic valve measured 4.8 m/s corresponding to mean transvalvular gradient estimated 49 mmHg and DVI 0.21 with calculated aortic valve area 0.72 cm.  Left ventricular function remain normal.  The patient was subsequently referred for diagnostic cardiac catheterization which was performed April 30, 2017 by Dr. Clifton Famous.  Catheterization confirmed the presence of severe aortic stenosis with peak to peak and mean transvalvular gradients measured 49 and 42 mmHg, respectively.  There was moderate multivessel coronary artery disease with moderate stenosis of the proximal and mid left anterior descending coronary artery and severe stenosis in the distal left anterior descending coronary artery.  There was mild nonobstructive disease in the left circumflex and right coronary artery territories.  Cardiothoracic surgical consultation was requested.  During the course of the patient's preoperative work up they have been evaluated comprehensively by a multidisciplinary team of specialists coordinated through the [facility] in the [facility].  They have been demonstrated to suffer from symptomatic severe aortic stenosis as noted above. The patient has been counseled extensively as to the relative risks and benefits of all options for the treatment of severe aortic stenosis including long term medical therapy, conventional surgery for aortic valve replacement, and transcatheter aortic valve replacement.  All questions have been answered, and the patient provides full  informed consent for the operation as described.   DETAILS OF THE OPERATIVE  PROCEDURE  PREPARATION:    The patient is brought to the operating room on the above mentioned date and central monitoring was established by the anesthesia team including placement of a central venous line and radial arterial line. The patient is placed in the supine position on the operating table.  Intravenous antibiotics are administered. The patient is monitored closely throughout the procedure under conscious sedation.     Baseline transthoracic echocardiogram was performed. The patient's chest, abdomen, both groins, and both lower extremities are prepared and draped in a sterile manner. A time out procedure is performed.   PERIPHERAL ACCESS:    Using the modified Seldinger technique, femoral arterial and venous access was obtained with placement of 6 Fr sheaths on the left side.  A pigtail diagnostic catheter was passed through the left arterial sheath under fluoroscopic guidance into the aortic root.  A temporary transvenous pacemaker catheter was passed through the left femoral venous sheath under fluoroscopic guidance into the right ventricle.  The pacemaker was tested to ensure stable lead placement and pacemaker capture. Aortic root angiography was performed in order to determine the optimal angiographic angle for valve deployment.   TRANSFEMORAL ACCESS:   Percutaneous transfemoral access and sheath placement was performed using ultrasound guidance.  The right common femoral artery was cannulated using a micropuncture needle and appropriate location was verified using hand injection angiogram.  A pair of Abbott Perclose percutaneous closure devices were placed and a 6 French sheath replaced into the femoral artery.  The patient was heparinized systemically and ACT verified > 250 seconds.    A 16 Fr transfemoral E-sheath was introduced into the right common femoral artery after progressively dilating over an  Amplatz superstiff wire. An AL-2 catheter was used to direct a straight-tip exchange length wire across the native aortic valve into the left ventricle. This was exchanged out for a pigtail catheter and position was confirmed in the LV apex. Simultaneous LV and Ao pressures were recorded.  The pigtail catheter was exchanged for an Amplatz Extra-stiff wire in the LV apex.  Echocardiography was utilized to confirm appropriate wire position and no sign of entanglement in the mitral subvalvular apparatus.   TRANSCATHETER HEART VALVE DEPLOYMENT:   An Edwards Sapien 3 transcatheter heart valve (size 29 mm, model #9600TFX, serial #[id number]) was prepared and crimped per manufacturer's guidelines, and the proper orientation of the valve is confirmed on the [employer] delivery system. The valve was advanced through the introducer sheath using normal technique until in an appropriate position in the abdominal aorta beyond the sheath tip. The balloon was then retracted and using the fine-tuning wheel was centered on the valve. The valve was then advanced across the aortic arch using appropriate flexion of the catheter. The valve was carefully positioned across the aortic valve annulus. The Commander catheter was retracted using normal technique. Once final position of the valve has been confirmed by angiographic assessment, the valve is deployed while temporarily holding ventilation and during rapid ventricular pacing to maintain systolic blood pressure < 50 mmHg and pulse pressure < 10 mmHg. The balloon inflation is held for >3 seconds after reaching full deployment volume. Once the balloon has fully deflated the balloon is retracted into the ascending aorta and valve function is assessed using echocardiography. There is felt to be trace paravalvular leak and no central aortic insufficiency.  The patient's hemodynamic recovery following valve deployment is good.  The deployment balloon and guidewire are both  removed.    PROCEDURE COMPLETION:  The sheath was removed and femoral artery closure performed.  Protamine was administered once femoral arterial repair was complete. The temporary pacemaker, pigtail catheters and femoral sheaths were removed with manual pressure used for hemostasis.   The patient tolerated the procedure well and is transported to the surgical intensive care in stable condition. There were no immediate intraoperative complications. All sponge instrument and needle counts are verified correct at completion of the operation.   No blood products were administered during the operation.  The patient received a total of 60.4 mL of intravenous contrast during the procedure.   Purcell Nails, MD 06/03/2017 11:36 AM

## 2017-06-03 NOTE — Interval H&P Note (Signed)
History and Physical Interval Note:  06/03/2017 9:05 AM  Jason Anthony  has presented today for surgery, with the diagnosis of Severe Aortic Stenosis  The various methods of treatment have been discussed with the patient and family. After consideration of risks, benefits and other options for treatment, the patient has consented to  Procedure(s): TRANSCATHETER AORTIC VALVE REPLACEMENT, TRANSFEMORAL (N/A) TRANSESOPHAGEAL ECHOCARDIOGRAM (TEE) (N/A) as a surgical intervention .  The patient's history has been reviewed, patient examined, no change in status, stable for surgery.  I have reviewed the patient's chart and labs.  Questions were answered to the patient's satisfaction.     Verne Carrow

## 2017-06-03 NOTE — Anesthesia Procedure Notes (Signed)
Procedure Name: MAC Date/Time: 06/03/2017 9:58 AM Performed by: Barrington Ellison, CRNA Pre-anesthesia Checklist: Patient identified, Emergency Drugs available, Suction available, Patient being monitored and Timeout performed Patient Re-evaluated:Patient Re-evaluated prior to induction Oxygen Delivery Method: Simple face mask

## 2017-06-03 NOTE — H&P (View-Only) (Signed)
Cardiology Admission History and Physical:   Patient ID: Jason Anthony; MRN: [id number]; DOB: May 26, 1945   Admission date: 06/03/2017  Primary Care Provider: Bedelia Person, MD Primary Cardiologist: Eden Emms  Chief Complaint:  Dizziness, dyspnea  History of Present Illness:   Jason Anthony is a 72 yo male with history of pulm sarcoidosis, HTN, HLD, CKD and severe aortic stenosis with recent worsening of his AS. Cardiac cath with severe distal LAD stenosis, moderate proximal and mid LAD stenosis, mild non-obstructive disease in the RCA and Circumflex. Echo with normal LV systolic function, severe AS, mean gradient 49 mmHg. He is here today for TAVR. He notes ongoing dizziness and dyspnea.    Past Medical History:  Diagnosis Date  . Acute renal failure (HCC)   . Anxiety   . Arthritis    "left shoulder" (04/30/2017)  . Bilateral impacted cerumen   . Chronic lower back pain   . DDD (degenerative disc disease), lumbar   . DOE (dyspnea on exertion)   . Dyspnea   . Factitious dermatitis   . Heart murmur   . History of gout    "no problems w/it anymore; not on RX anymore" (04/30/2017)  . Hyperglycemia   . Hyperkalemia   . Hyperlipidemia   . Hypertension   . Joint pain in the shoulder/clavicle region   . Low back pain   . Occasional tremors   . Pneumonia   . Sarcoidosis   . Severe aortic stenosis   . Thrombocytopenia (HCC)     Past Surgical History:  Procedure Laterality Date  . CARDIAC CATHETERIZATION  04/30/2017  . COLONOSCOPY    . HAMMER TOE SURGERY     "?toe; ?side"  . INGUINAL HERNIA REPAIR Right   . LAPAROSCOPIC CHOLECYSTECTOMY    . RIGHT/LEFT HEART CATH AND CORONARY ANGIOGRAPHY N/A 04/30/2017   Procedure: RIGHT/LEFT HEART CATH AND CORONARY ANGIOGRAPHY;  Surgeon: Kathleene Hazel, MD;  Location: [facility];  Service: Cardiovascular;  Laterality: N/A;  . SALIVARY STONE REMOVAL  1961   "spit gland had a stone on it"     Medications Prior to Admission: Prior to  Admission medications   Medication Sig Start Date End Date Taking? Authorizing Provider  aspirin EC 81 MG tablet Take 1 tablet (81 mg total) by mouth daily. Patient taking differently: Take 81 mg by mouth daily after breakfast.  07/15/16  Yes Wendall Stade, MD  clonazePAM (KLONOPIN) 0.5 MG tablet Take 0.5 mg by mouth 2 (two) times daily as needed for anxiety.   Yes [provider]  simvastatin (ZOCOR) 40 MG tablet Take 40 mg by mouth daily after supper.    Yes [provider]  traZODone (DESYREL) 50 MG tablet Take 100 mg by mouth at bedtime.    Yes [provider]     Allergies:   No Known Allergies  Social History:   Social History   Socioeconomic History  . Marital status: Divorced    Spouse name: Not on file  . Number of children: Not on file  . Years of education: Not on file  . Highest education level: Not on file  Occupational History  . Occupation: [employer]  Social Needs  . Financial resource strain: Not on file  . Food insecurity:    Worry: Not on file    Inability: Not on file  . Transportation needs:    Medical: Not on file    Non-medical: Not on file  Tobacco Use  . Smoking status: Never Smoker  .  Smokeless tobacco: Never Used  Substance and Sexual Activity  . Alcohol use: No  . Drug use: Never  . Sexual activity: Not Currently  Lifestyle  . Physical activity:    Days per week: Not on file    Minutes per session: Not on file  . Stress: Not on file  Relationships  . Social connections:    Talks on phone: Not on file    Gets together: Not on file    Attends religious service: Not on file    Active member of club or organization: Not on file    Attends meetings of clubs or organizations: Not on file    Relationship status: Not on file  . Intimate partner violence:    Fear of current or ex partner: Not on file    Emotionally abused: Not on file    Physically abused: Not on file    Forced sexual activity: Not on file  Other Topics  Concern  . Not on file  Social History Narrative  . Not on file    Family History:   The patient's family history includes Breast cancer in his sister; CVA (age of onset: 61) in his father; Hypertension in his mother.    ROS:  Please see the history of present illness.  All other ROS reviewed and negative.     Physical Exam/Data:   Vitals:   06/03/17 0815 06/03/17 0816  BP:  (!) 148/77  Pulse:  76  Resp:  18  Temp:  97.7 F (36.5 C)  TempSrc:  Oral  SpO2:  98%  Weight: 195 lb (88.5 kg)   Height:  (1.88 m)    No intake or output data in the 24 hours ending 06/03/17 0900 Filed Weights   06/03/17 0815  Weight: 195 lb (88.5 kg)   Body mass index is 25.04 kg/m.  General:  Well nourished, well developed, in no acute distress HEENT: normal Lymph: no adenopathy Neck: no JVD Endocrine:  No thryomegaly Vascular: No carotid bruits; FA pulses 2+ bilaterally without bruits  Cardiac: RRR, loud harsh systolic murmur Lungs:  clear to auscultation bilaterally, no wheezing, rhonchi or rales  Abd: soft, nontender, no hepatomegaly  Ext: no LE edema Musculoskeletal:  No deformities, BUE and BLE strength normal and equal Skin: warm and dry  Neuro:  CNs 2-12 intact, no focal abnormalities noted Psych:  Normal affect   Laboratory Data:  Chemistry Recent Labs  Lab 05/28/17 1022  NA 140  K 4.1  CL 105  CO2 24  GLUCOSE 147*  BUN 12  CREATININE 1.45*  CALCIUM 9.1  GFRNONAA 46*  GFRAA 53*  ANIONGAP 11    Recent Labs  Lab 05/28/17 1022  PROT 7.1  ALBUMIN 3.9  AST 22  ALT 11*  ALKPHOS 52  BILITOT 1.8*   Hematology Recent Labs  Lab 05/28/17 1022  WBC 4.4  RBC 3.69*  HGB 11.7*  HCT 34.4*  MCV 93.2  MCH 31.7  MCHC 34.0  RDW 12.6  PLT 107*   Cardiac EnzymesNo results for input(s): TROPONINI in the last 168 hours. No results for input(s): TROPIPOC in the last 168 hours.  BNP Recent Labs  Lab 05/28/17 1023  BNP 48.6    DDimer No results for input(s):  DDIMER in the last 168 hours.  Radiology/Studies:  No results found.  Assessment and Plan:   1. Severe AS: Plans for TAVR today  For questions or updates, please contact [facility] Please consult [email or web address] for contact  info under Cardiology/STEMI.    Signed, Verne Carrow, MD  06/03/2017 9:00 AM

## 2017-06-03 NOTE — Transfer of Care (Signed)
Immediate Anesthesia Transfer of Care Note  Patient: Jason Anthony  Procedure(s) Performed: TRANSCATHETER AORTIC VALVE REPLACEMENT, TRANSFEMORAL (N/A Chest) INTRAOPERATIVE TRANSTHORACIC ECHOCARDIOGRAM (N/A )  Patient Location: ICU  Anesthesia Type:MAC  Level of Consciousness: drowsy, patient cooperative and responds to stimulation  Airway & Oxygen Therapy: Patient Spontanous Breathing and Patient connected to nasal cannula oxygen  Post-op Assessment: Report given to RN  Post vital signs: Reviewed and stable  Last Vitals:  Vitals Value Taken Time  BP    Temp    Pulse    Resp    SpO2      Last Pain:  Vitals:   06/03/17 0816  TempSrc: Oral  PainSc:       Patients Stated Pain Goal: 2 (63/87/56 4332)  Complications: No apparent anesthesia complications

## 2017-06-03 NOTE — Progress Notes (Signed)
  Echocardiogram 2D Echocardiogram limited for TAVR has been performed.  Leta Jungling M 06/03/2017, 11:10 AM

## 2017-06-03 NOTE — Anesthesia Postprocedure Evaluation (Signed)
Anesthesia Post Note  Patient: Jason Anthony  Procedure(s) Performed: TRANSCATHETER AORTIC VALVE REPLACEMENT, TRANSFEMORAL (N/A Chest) INTRAOPERATIVE TRANSTHORACIC ECHOCARDIOGRAM (N/A )     Patient location during evaluation: SICU Anesthesia Type: MAC Level of consciousness: awake Pain management: pain level controlled Vital Signs Assessment: post-procedure vital signs reviewed and stable Respiratory status: spontaneous breathing, nonlabored ventilation and respiratory function stable Cardiovascular status: stable and blood pressure returned to baseline Postop Assessment: no apparent nausea or vomiting Anesthetic complications: no    Last Vitals:  Vitals:   06/03/17 1245 06/03/17 1300  BP:    Pulse: (!) 43 (!) 45  Resp: (!) 22 (!) 22  Temp:    SpO2: 97% (!) 89%    Last Pain:  Vitals:   06/03/17 0816  TempSrc: Oral  PainSc:                  Ameen Mostafa,W. EDMOND

## 2017-06-03 NOTE — Progress Notes (Signed)
Cardiology Admission History and Physical:   Patient ID: Jason Anthony; MRN: [id number]; DOB: 05/20/1945   Admission date: 06/03/2017  Primary Care Provider: Aaron, Caren T, MD Primary Cardiologist: Nishan  Chief Complaint:  Dizziness, dyspnea  History of Present Illness:   [name] is a 72 yo male with history of pulm sarcoidosis, HTN, HLD, CKD and severe aortic stenosis with recent worsening of his AS. Cardiac cath with severe distal LAD stenosis, moderate proximal and mid LAD stenosis, mild non-obstructive disease in the RCA and Circumflex. Echo with normal LV systolic function, severe AS, mean gradient 49 mmHg. He is here today for TAVR. He notes ongoing dizziness and dyspnea.    Past Medical History:  Diagnosis Date  . Acute renal failure (HCC)   . Anxiety   . Arthritis    "left shoulder" (04/30/2017)  . Bilateral impacted cerumen   . Chronic lower back pain   . DDD (degenerative disc disease), lumbar   . DOE (dyspnea on exertion)   . Dyspnea   . Factitious dermatitis   . Heart murmur   . History of gout    "no problems w/it anymore; not on RX anymore" (04/30/2017)  . Hyperglycemia   . Hyperkalemia   . Hyperlipidemia   . Hypertension   . Joint pain in the shoulder/clavicle region   . Low back pain   . Occasional tremors   . Pneumonia   . Sarcoidosis   . Severe aortic stenosis   . Thrombocytopenia (HCC)     Past Surgical History:  Procedure Laterality Date  . CARDIAC CATHETERIZATION  04/30/2017  . COLONOSCOPY    . HAMMER TOE SURGERY     "?toe; ?side"  . INGUINAL HERNIA REPAIR Right   . LAPAROSCOPIC CHOLECYSTECTOMY    . RIGHT/LEFT HEART CATH AND CORONARY ANGIOGRAPHY N/A 04/30/2017   Procedure: RIGHT/LEFT HEART CATH AND CORONARY ANGIOGRAPHY;  Surgeon: McAlhany, Christopher D, MD;  Location: [facility];  Service: Cardiovascular;  Laterality: N/A;  . SALIVARY STONE REMOVAL  1961   "spit gland had a stone on it"     Medications Prior to Admission: Prior to  Admission medications   Medication Sig Start Date End Date Taking? Authorizing Provider  aspirin EC 81 MG tablet Take 1 tablet (81 mg total) by mouth daily. Patient taking differently: Take 81 mg by mouth daily after breakfast.  07/15/16  Yes Nishan, Peter C, MD  clonazePAM (KLONOPIN) 0.5 MG tablet Take 0.5 mg by mouth 2 (two) times daily as needed for anxiety.   Yes [provider]  simvastatin (ZOCOR) 40 MG tablet Take 40 mg by mouth daily after supper.    Yes [provider]  traZODone (DESYREL) 50 MG tablet Take 100 mg by mouth at bedtime.    Yes [provider]     Allergies:   No Known Allergies  Social History:   Social History   Socioeconomic History  . Marital status: Divorced    Spouse name: Not on file  . Number of children: Not on file  . Years of education: Not on file  . Highest education level: Not on file  Occupational History  . Occupation: [employer]  Social Needs  . Financial resource strain: Not on file  . Food insecurity:    Worry: Not on file    Inability: Not on file  . Transportation needs:    Medical: Not on file    Non-medical: Not on file  Tobacco Use  . Smoking status: Never Smoker  .   Smokeless tobacco: Never Used  Substance and Sexual Activity  . Alcohol use: No  . Drug use: Never  . Sexual activity: Not Currently  Lifestyle  . Physical activity:    Days per week: Not on file    Minutes per session: Not on file  . Stress: Not on file  Relationships  . Social connections:    Talks on phone: Not on file    Gets together: Not on file    Attends religious service: Not on file    Active member of club or organization: Not on file    Attends meetings of clubs or organizations: Not on file    Relationship status: Not on file  . Intimate partner violence:    Fear of current or ex partner: Not on file    Emotionally abused: Not on file    Physically abused: Not on file    Forced sexual activity: Not on file  Other Topics  Concern  . Not on file  Social History Narrative  . Not on file    Family History:   The patient's family history includes Breast cancer in his sister; CVA (age of onset: 70) in his father; Hypertension in his mother.    ROS:  Please see the history of present illness.  All other ROS reviewed and negative.     Physical Exam/Data:   Vitals:   06/03/17 0815 06/03/17 0816  BP:  (!) 148/77  Pulse:  76  Resp:  18  Temp:  97.7 F (36.5 C)  TempSrc:  Oral  SpO2:  98%  Weight: 195 lb (88.5 kg)   Height: 6' 2" (1.88 m)    No intake or output data in the 24 hours ending 06/03/17 0900 Filed Weights   06/03/17 0815  Weight: 195 lb (88.5 kg)   Body mass index is 25.04 kg/m.  General:  Well nourished, well developed, in no acute distress HEENT: normal Lymph: no adenopathy Neck: no JVD Endocrine:  No thryomegaly Vascular: No carotid bruits; FA pulses 2+ bilaterally without bruits  Cardiac: RRR, loud harsh systolic murmur Lungs:  clear to auscultation bilaterally, no wheezing, rhonchi or rales  Abd: soft, nontender, no hepatomegaly  Ext: no LE edema Musculoskeletal:  No deformities, BUE and BLE strength normal and equal Skin: warm and dry  Neuro:  CNs 2-12 intact, no focal abnormalities noted Psych:  Normal affect   Laboratory Data:  Chemistry Recent Labs  Lab 05/28/17 1022  NA 140  K 4.1  CL 105  CO2 24  GLUCOSE 147*  BUN 12  CREATININE 1.45*  CALCIUM 9.1  GFRNONAA 46*  GFRAA 53*  ANIONGAP 11    Recent Labs  Lab 05/28/17 1022  PROT 7.1  ALBUMIN 3.9  AST 22  ALT 11*  ALKPHOS 52  BILITOT 1.8*   Hematology Recent Labs  Lab 05/28/17 1022  WBC 4.4  RBC 3.69*  HGB 11.7*  HCT 34.4*  MCV 93.2  MCH 31.7  MCHC 34.0  RDW 12.6  PLT 107*   Cardiac EnzymesNo results for input(s): TROPONINI in the last 168 hours. No results for input(s): TROPIPOC in the last 168 hours.  BNP Recent Labs  Lab 05/28/17 1023  BNP 48.6    DDimer No results for input(s):  DDIMER in the last 168 hours.  Radiology/Studies:  No results found.  Assessment and Plan:   1. Severe AS: Plans for TAVR today  For questions or updates, please contact [facility] Please consult [email or web address] for contact   info under Cardiology/STEMI.    Signed, Christopher McAlhany, MD  06/03/2017 9:00 AM  

## 2017-06-04 ENCOUNTER — Other Ambulatory Visit: Payer: Self-pay | Admitting: Physician Assistant

## 2017-06-04 ENCOUNTER — Inpatient Hospital Stay (HOSPITAL_COMMUNITY): Payer: Medicare PPO

## 2017-06-04 ENCOUNTER — Encounter (HOSPITAL_COMMUNITY): Payer: Self-pay | Admitting: Cardiovascular Disease

## 2017-06-04 ENCOUNTER — Other Ambulatory Visit: Payer: Self-pay

## 2017-06-04 DIAGNOSIS — I350 Nonrheumatic aortic (valve) stenosis: Secondary | ICD-10-CM

## 2017-06-04 DIAGNOSIS — I361 Nonrheumatic tricuspid (valve) insufficiency: Secondary | ICD-10-CM

## 2017-06-04 DIAGNOSIS — Z954 Presence of other heart-valve replacement: Secondary | ICD-10-CM

## 2017-06-04 LAB — BASIC METABOLIC PANEL
Anion gap: 8 (ref 5–15)
BUN: 11 mg/dL (ref 6–20)
CHLORIDE: 103 mmol/L (ref 101–111)
CO2: 28 mmol/L (ref 22–32)
CREATININE: 1.35 mg/dL — AB (ref 0.61–1.24)
Calcium: 8.4 mg/dL — ABNORMAL LOW (ref 8.9–10.3)
GFR calc Af Amer: 58 mL/min — ABNORMAL LOW (ref >60)
GFR calc non Af Amer: 50 mL/min — ABNORMAL LOW (ref >60)
GLUCOSE: 77 mg/dL (ref 65–99)
Potassium: 4.3 mmol/L (ref 3.5–5.1)
Sodium: 139 mmol/L (ref 135–145)

## 2017-06-04 LAB — CBC
HEMATOCRIT: 28.3 % — AB (ref 39.0–52.0)
HEMOGLOBIN: 9.4 g/dL — AB (ref 13.0–17.0)
MCH: 31.6 pg (ref 26.0–34.0)
MCHC: 33.2 g/dL (ref 30.0–36.0)
MCV: 95.3 fL (ref 78.0–100.0)
Platelets: 74 10*3/uL — ABNORMAL LOW (ref 150–400)
RBC: 2.97 MIL/uL — ABNORMAL LOW (ref 4.22–5.81)
RDW: 12.8 % (ref 11.5–15.5)
WBC: 4.3 10*3/uL (ref 4.0–10.5)

## 2017-06-04 LAB — ECHOCARDIOGRAM COMPLETE
Height: 74 in
WEIGHTICAEL: 3156.99 [oz_av]

## 2017-06-04 LAB — MAGNESIUM: Magnesium: 1.6 mg/dL — ABNORMAL LOW (ref 1.7–2.4)

## 2017-06-04 MED ORDER — ZOLPIDEM TARTRATE 5 MG PO TABS
5.0000 mg | ORAL_TABLET | Freq: Every day | ORAL | Status: DC
  Administered 2017-06-04: 5 mg via ORAL
  Filled 2017-06-04: qty 1

## 2017-06-04 MED ORDER — SODIUM CHLORIDE 0.9 % IV BOLUS
500.0000 mL | Freq: Once | INTRAVENOUS | Status: AC
  Administered 2017-06-04: 500 mL via INTRAVENOUS

## 2017-06-04 NOTE — Progress Notes (Signed)
TCTS BRIEF SICU PROGRESS NOTE  1 Day Post-Op  S/P Procedure(s) (LRB): TRANSCATHETER AORTIC VALVE REPLACEMENT, TRANSFEMORAL (N/A) INTRAOPERATIVE TRANSTHORACIC ECHOCARDIOGRAM (N/A)   Stable day NSR w/ stable BP ECHO looks good  Plan: Transfer step down  Purcell Nails, MD 06/04/2017 5:35 PM

## 2017-06-04 NOTE — Progress Notes (Addendum)
[facility]   MULTIDISCIPLINARY HEART VALVE TEAM  Patient Name: Jason Anthony Date of Encounter: 06/04/2017  Primary Cardiologist: Dr. Eden Emms / Dr. Clifton Conley & Dr. Jerel Shepherd Problem List     Principal Problem:   S/P TAVR (transcatheter aortic valve replacement) Active Problems:   Severe aortic stenosis   Hypertension   Hyperlipidemia   CKD (chronic kidney disease)   Sarcoidosis   Thrombocytopenia (HCC)     Subjective   Feeling okay. No chest pain or SOB. Having some dizziness and worried about his BP.   Inpatient Medications    Scheduled Meds: . acetaminophen  1,000 mg Oral Q6H  . aspirin EC  81 mg Oral Daily  . Chlorhexidine Gluconate Cloth  6 each Topical Daily  . clopidogrel  75 mg Oral Q breakfast  . pantoprazole  40 mg Oral Daily  . simvastatin  40 mg Oral QPC supper  . sodium chloride flush  10-40 mL Intracatheter Q12H  . traZODone  100 mg Oral QHS   Continuous Infusions: . sodium chloride 50 mL/hr at 06/03/17 1253  . albumin human    . cefUROXime (ZINACEF)  IV Stopped (06/04/17 0508)   PRN Meds: albumin human, clonazePAM, metoprolol tartrate, ondansetron (ZOFRAN) IV, sodium chloride flush, traMADol   Vital Signs    Vitals:   06/04/17 0400 06/04/17 0500 06/04/17 0600 06/04/17 0748  BP: (!) 93/52  (!) 108/59   Pulse:      Resp: 20  (!) 22   Temp:    98.6 F (37 C)  TempSrc:    Oral  SpO2: 95% 97% 96%   Weight:  197 lb 5 oz (89.5 kg)    Height:   (1.88 m)      Intake/Output Summary (Last 24 hours) at 06/04/2017 0759 Last data filed at 06/04/2017 0500 Gross per 24 hour  Intake 2134.29 ml  Output 375 ml  Net 1759.29 ml   Filed Weights   06/03/17 0815 06/04/17 0500  Weight: 195 lb (88.5 kg) 197 lb 5 oz (89.5 kg)    Physical Exam   GEN: Well nourished, well developed, in no acute distress.  HEENT: Grossly normal.  Neck: Supple, no JVD, carotid bruits, or masses. Cardiac: RRR, no murmurs, rubs, or gallops. No clubbing,  cyanosis, edema.  Radials/DP/PT 2+ and equal bilaterally.  Respiratory:  Crackles at bases GI: Soft, nontender, nondistended, BS + x 4. MS: no deformity or atrophy. Skin: warm and dry, no rash. Neuro:  Strength and sensation are intact. Psych: AAOx3.  Normal affect.  Labs    CBC Recent Labs    06/03/17 1153 06/04/17 0440  WBC  --  4.3  HGB 8.8* 9.4*  HCT 26.0* 28.3*  MCV  --  95.3  PLT  --  74*   Basic Metabolic Panel Recent Labs    16/10/96 1009 06/03/17 1153 06/04/17 0440  NA 142 140 139  K 4.1 4.2 4.3  CL 102  --  103  CO2  --   --  28  GLUCOSE 95 126* 77  BUN 10  --  11  CREATININE 1.50*  --  1.35*  CALCIUM  --   --  8.4*  MG  --   --  1.6*   Liver Function Tests No results for input(s): AST, ALT, ALKPHOS, BILITOT, PROT, ALBUMIN in the last 72 hours. No results for input(s): LIPASE, AMYLASE in the last 72 hours. Cardiac Enzymes No results for input(s): CKTOTAL, CKMB, CKMBINDEX, TROPONINI in the  last 72 hours. BNP Invalid input(s): POCBNP D-Dimer No results for input(s): DDIMER in the last 72 hours. Hemoglobin A1C No results for input(s): HGBA1C in the last 72 hours. Fasting Lipid Panel No results for input(s): CHOL, HDL, LDLCALC, TRIG, CHOLHDL, LDLDIRECT in the last 72 hours. Thyroid Function Tests No results for input(s): TSH, T4TOTAL, T3FREE, THYROIDAB in the last 72 hours.  Invalid input(s): FREET3  Telemetry    Sinus with some sinus brady - Personally Reviewed  ECG    NSR with non specific intraventricular conduction delay  - Personally Reviewed  Radiology    Dg Chest Port 1 View  Result Date: 06/03/2017 CLINICAL DATA:  Status post TAVR EXAM: PORTABLE CHEST 1 VIEW COMPARISON:  05/28/2017 FINDINGS: There is a right jugular central venous catheter with the tip projecting over the SVC. There is bilateral mild interstitial thickening likely reflecting interstitial edema. There is no focal consolidation. There is no pleural effusion or  pneumothorax. The heart and mediastinal contours are unremarkable. There is evidence of interval TAVR. The osseous structures are unremarkable. IMPRESSION: 1. Mild interstitial edema. 2. Interval TAVR. 3. Right jugular central venous catheter with the tip projecting over the SVC. Electronically Signed   By: Elige Ko   On: 06/03/2017 12:27    Cardiac Studies   TAVR OPERATIVE NOTE   Date of Procedure:                06/03/2017  Preoperative Diagnosis:      Severe Aortic Stenosis   Procedure:        Transcatheter Aortic Valve Replacement - Percutaneous Right Transfemoral Approach             Edwards Sapien 3 THV (size 29 mm, model # 9600TFX, serial # [id number])              Co-Surgeons:                        Salvatore Decent. Cornelius Moras, MD and Verne Carrow, MD  Pre-operative Echo Findings: ? Severe aortic stenosis ? Normal left ventricular systolic function  Post-operative Echo Findings: ? Trace paravalvular leak ? Normal left ventricular systolic function    Patient Profile   Jason Anthony is a 72 y.o. male with a history of HTN, HLD, sarcoidosis, CKD, DDD, essential tremor and severe AS who presented to [facility] on 06/03/17 for planned TAVR.    Assessment & Plan    Severe AS: s/p TAVR successful TAVR with a 29 mm Edwards Sapien THV via the TF approach on 06/03/17. Post operative echo pending today. Groin sites stable. Continue ASA and plavix.   HTN, currently with hypotension: he had some hypotension overnight with SBP in 80-90s. He was dizzy when getting up to go to the bathroom this morning. BP remains soft ~108/59. Will have echo done first thing this morning. He does not appear dehydrated and has some mild crackles on lung exam and possible interstitial edema on CXR, so will hold off on IVFs for now. Hg stable and groin sites look good. He reports issues with hypotension leading up to his TAVR and a gradual elimination of his Losartan. He is not currently on any  antihypertensives. He is on Trazadone for sleep which can cause hypotension and dizziness. I will stop this now.   Insomnia: he has a lot of trouble sleeping at night. I will try ambien tonight since we are stopping trazadone.   Abnormal chest CT: pre TAVR CT reported an "  unusual appearance of the lungs which is of uncertain etiology and significance. Although a portion of these findings could be attributable to a background of some interstitial pulmonary edema, the possibility of interstitial lung disease should be considered." Followup nonemergent outpatient high-resolution chest CT is suggested after the patient's procedure and once the patient has fully recovered to better evaluate for underlying interstitial lung disease."  He does have crackles on lung exam. Will arrange this as an outpatient.   CKD: creat stable at 1.35   Signed, Cline Crock, PA-C  06/04/2017, 7:59 AM  Pager [phone]  I have seen and examined the patient and agree with the assessment and plan as outlined.  Looks good and maintaining NSR but BP borderline low and patient reports some orthostatic symptoms.  Check ECHO  Purcell Nails, MD 06/04/2017 8:37 AM

## 2017-06-04 NOTE — Consult Note (Signed)
Spoke with Al Decant, Charity fundraiser. She is aware to remove CVC

## 2017-06-05 LAB — CBC
HEMATOCRIT: 28.9 % — AB (ref 39.0–52.0)
HEMOGLOBIN: 9.5 g/dL — AB (ref 13.0–17.0)
MCH: 31.6 pg (ref 26.0–34.0)
MCHC: 32.9 g/dL (ref 30.0–36.0)
MCV: 96 fL (ref 78.0–100.0)
Platelets: 70 10*3/uL — ABNORMAL LOW (ref 150–400)
RBC: 3.01 MIL/uL — AB (ref 4.22–5.81)
RDW: 12.9 % (ref 11.5–15.5)
WBC: 4.9 10*3/uL (ref 4.0–10.5)

## 2017-06-05 LAB — BASIC METABOLIC PANEL
ANION GAP: 5 (ref 5–15)
BUN: 11 mg/dL (ref 6–20)
CHLORIDE: 105 mmol/L (ref 101–111)
CO2: 29 mmol/L (ref 22–32)
Calcium: 8.5 mg/dL — ABNORMAL LOW (ref 8.9–10.3)
Creatinine, Ser: 1.47 mg/dL — ABNORMAL HIGH (ref 0.61–1.24)
GFR calc non Af Amer: 45 mL/min — ABNORMAL LOW (ref >60)
GFR, EST AFRICAN AMERICAN: 52 mL/min — AB (ref >60)
Glucose, Bld: 88 mg/dL (ref 65–99)
POTASSIUM: 4.4 mmol/L (ref 3.5–5.1)
Sodium: 139 mmol/L (ref 135–145)

## 2017-06-05 MED ORDER — CLOPIDOGREL BISULFATE 75 MG PO TABS: 75.0000 mg | ORAL_TABLET | Freq: Every day | ORAL | 2 refills | Status: DC

## 2017-06-05 MED FILL — Magnesium Sulfate Inj 50%: INTRAMUSCULAR | Qty: 10 | Status: AC

## 2017-06-05 MED FILL — Heparin Sodium (Porcine) Inj 1000 Unit/ML: INTRAMUSCULAR | Qty: 30 | Status: AC

## 2017-06-05 MED FILL — Potassium Chloride Inj 2 mEq/ML: INTRAVENOUS | Qty: 40 | Status: AC

## 2017-06-05 MED FILL — Phenylephrine HCl IV Soln 10 MG/ML: INTRAVENOUS | Qty: 2 | Status: AC

## 2017-06-05 MED FILL — Sodium Chloride IV Soln 0.9%: INTRAVENOUS | Qty: 250 | Status: AC

## 2017-06-05 NOTE — Discharge Instructions (Signed)

## 2017-06-05 NOTE — Progress Notes (Signed)
IV and telemetry discontinued. Dressing to right neck changed. Patient tolerated well.

## 2017-06-05 NOTE — Discharge Summary (Addendum)
[facility]   MULTIDISCIPLINARY HEART VALVE TEAM   Discharge Summary    Patient ID: Jason Anthony,  MRN: [id number], DOB/AGE: [id number] y.o.  Admit date: 06/03/2017 Discharge date: 06/05/2017  Primary Care Provider: Fanny Dance T Primary Cardiologist:  Dr. Eden Emms / Dr. Clifton Wray & Dr. Cornelius Moras (TAVR)   Discharge Diagnoses    Principal Problem:   S/P TAVR (transcatheter aortic valve replacement) Active Problems:   Severe aortic stenosis   Hypertension   Hyperlipidemia   CKD (chronic kidney disease)   Sarcoidosis   Thrombocytopenia (HCC)   Allergies No Known Allergies   History of Present Illness     Jason Anthony is a 72 y.o. male with a history of HTN, HLD, sarcoidosis, CKD, DDD, essential tremor and severe AS who presented to [facility] on 06/03/17 for planned TAVR.   The patient states that he was first noted to have a heart murmur several years ago by his primary care physician. He was [place] to [place] in [place] and was diagnosed with moderate aortic stenosis. Ech[place]ogram performed at [facility] [place] revealed severe aortic stenosis with normal left ventricular systolic function. Peak velocity across the aortic valve was reported 3.9 m/s corresponding to mean transvalvular gradient estimated 36 mmHg and aortic valve area calculated 0.61 cm. He was told that he might need to have surgical intervention eventually but medical follow-up was recommended. Shortly after thatthe patient was self-referred to Dr.Nishanwho happens to take care of a close friend of the patient's. A nuclear stress test was performed that revealed resting ejection fraction calculated 51% with nuclear imaging felt to be low risk for myocardial ischemia. His echocardiogram from [facility] reviewed and close follow-up was recommended. Recent follow-up echocardiogram performed April 23, 2017 revealed significant progression and severity of  aortic stenosis. Peak velocity across aortic valve measured 4.8 m/s corresponding to mean transvalvular gradient estimated 49 mmHg and DVI 0.21 with calculated aortic valve area 0.72 cm. Left ventricular function remain normal. The patient was subsequently referred for diagnostic cardiac catheterization which was performed April 30, 2017 by Dr. Clifton Joban.Catheterization confirmed the presence of severe aortic stenosis with peak to peak and mean transvalvular gradients measured 49and , respectively. There was moderate multivessel coronary artery disease with moderate stenosis of the proximal and mid left anterior descending coronary artery and severe stenosis in the distal left anterior descending coronary artery. There was mild nonobstructive disease in the left circumflex and right coronary artery territories.   He was evaluated by the multidisciplinary valve team and deemed to be a candidate for TAVR, which was set up for 06/03/17.   Hospital Course     Consultants: none  Severe AS: s/p TAVR successful TAVR with a 29 mm Edwards Sapien THVvia the TF approach on 06/03/17. Post operative echo showed EF 65% and a normally functioning TAVR valve with trivial PVL and mildly elevated transvalvular mean gradient of 15 mm Hg. Groin sites stable. Continue ASA and plavix. He will be discharged today with 1 week TOC follow up.   HTN: BP now well controlled off all antihypertensives. He had some hypotension and dizziness which resolved with IV hydration. Trazadone was transiently held as it was felt to be possibly contributing.   Insomnia: he may resume Trazadone at discharge. If hypotension and dizziness returns, we will discontinue it at follow up.    Abnormal chest CT: pre TAVR CT reported an "unusual appearance of the lungs which is of uncertain etiology and significance.  Although a portion of these findings could be attributable to a background of some interstitial pulmonary edema, the  possibility of interstitial lung disease should be considered. Followup nonemergent outpatient high-resolution chest CT is suggested after the patient's procedure and once the patient has fully recovered to better evaluate for underlying interstitial lung disease."  He does have crackles on lung exam. Will discuss this as an outpatient.  CKD stage III: creat stable at 1.47   The patient has had an uncomplicated hospital course and is recovering well. The femoral catheter sites are stable. He has been seen by Dr. Cornelius Moras today and deemed ready for discharge home. All follow-up appointments have been scheduled. Discharge medications are listed below.  _____________  Discharge Vitals Blood pressure 131/67, pulse 69, temperature 99.2 F (37.3 C), temperature source Oral, resp. rate 19, height 6\' 2"  (1.88 m), weight 196 lb 14.4 oz (89.3 kg), SpO2 96 %.  Filed Weights   06/03/17 0815 06/04/17 0500 06/05/17 0435  Weight: 195 lb (88.5 kg) 197 lb 5 oz (89.5 kg) 196 lb 14.4 oz (89.3 kg)   VS:  BP 131/67 (BP Location: Right Arm)   Pulse 69   Temp 99.2 F (37.3 C) (Oral)   Resp 19   Ht 6\' 2"  (1.88 m)   Wt 196 lb 14.4 oz (89.3 kg)   SpO2 96%   BMI 25.28 kg/m    GEN: Well nourished, well developed, in no acute distress  HEENT: normal  Neck: no JVD, carotid bruits, or masses Cardiac: RRR; no murmurs, rubs, or gallops,no edema  Respiratory: crackles at bases  GI: soft, nontender, nondistended, + BS MS: no deformity or atrophy  Skin: warm and dry, no rash Neuro:  Alert and Oriented x 3, Strength and sensation are intact Psych: euthymic mood, full affect   Labs & Radiologic Studies     CBC Recent Labs    06/04/17 0440 06/05/17 0319  WBC 4.3 4.9  HGB 9.4* 9.5*  HCT 28.3* 28.9*  MCV 95.3 96.0  PLT 74* 70*   Basic Metabolic Panel Recent Labs    16/10/96 0440 06/05/17 0319  NA 139 139  K 4.3 4.4  CL 103 105  CO2 28 29  GLUCOSE 77 88  BUN 11 11  CREATININE 1.35* 1.47*  CALCIUM  8.4* 8.5*  MG 1.6*  --    Liver Function Tests No results for input(s): AST, ALT, ALKPHOS, BILITOT, PROT, ALBUMIN in the last 72 hours. No results for input(s): LIPASE, AMYLASE in the last 72 hours. Cardiac Enzymes No results for input(s): CKTOTAL, CKMB, CKMBINDEX, TROPONINI in the last 72 hours. BNP Invalid input(s): POCBNP D-Dimer No results for input(s): DDIMER in the last 72 hours. Hemoglobin A1C No results for input(s): HGBA1C in the last 72 hours. Fasting Lipid Panel No results for input(s): CHOL, HDL, LDLCALC, TRIG, CHOLHDL, LDLDIRECT in the last 72 hours. Thyroid Function Tests No results for input(s): TSH, T4TOTAL, T3FREE, THYROIDAB in the last 72 hours.  Invalid input(s): FREET3  Dg Chest 2 View  Result Date: 05/28/2017 CLINICAL DATA:  Preop TAVR EXAM: CHEST - 2 VIEW COMPARISON:  CT chest 05/20/2017 FINDINGS: There is mild bilateral interstitial thickening. There is right pleural thickening. There is no pleural effusion or pneumothorax. The heart and mediastinal contours are unremarkable. The osseous structures are unremarkable. IMPRESSION: No active cardiopulmonary disease. Electronically Signed   By: Elige Ko   On: 05/28/2017 15:37   Ct Coronary Morph W/cta Cor Teressa Senter W/ca W/cm &/or Wo/cm  Addendum  Date: 05/21/2017   ADDENDUM REPORT: 05/21/2017 16:28 CLINICAL DATA:  71 -year-old male with severe aortic stenosis being evaluated for a TAVR procedure. EXAM: Cardiac TAVR CT TECHNIQUE: The patient was scanned on a [employer]. A 120 kV retrospective scan was triggered in the descending thoracic aorta at 111 HU's. Gantry rotation speed was 250 msecs and collimation was .6 mm. No beta blockade or nitro were given. The 3D data set was reconstructed in 5% intervals of the R-R cycle. Systolic and diastolic phases were analyzed on a dedicated work station using MPR, MIP and VRT modes. The patient received 80 cc of contrast. FINDINGS: Aortic Valve: Trileaflet aortic valve  with severe leaflet thickening and calcifications with severely restricted leaflet opening. There are no calcifications extending into the LVOT. Aorta: Normal size, mild calcifications, no dissection. Sinotubular Junction: 31 x 29 mm Ascending Thoracic Aorta: 36 x 35 mm Aortic Arch: 31 x 28 mm Descending Thoracic Aorta: 26 x 26 mm Sinus of Valsalva Measurements: Non-coronary: 38 mm Right -coronary: 34 mm Left -coronary: 36 mm Coronary Artery Height above Annulus: Left Main: 18 mm Right Coronary: 22 mm Virtual Basal Annulus Measurements: Maximum/Minimum Diameter: 30.9 x 25.4 mm Mean Diameter: 27.1 mm Perimeter: 86.6 mm Area:  576 mm2 Optimum Fluoroscopic Angle for Delivery:  LAO 10 CAU 7 IMPRESSION: 1. Trileaflet aortic valve with severe leaflet thickening and calcifications with severely restricted leaflet opening. There are no calcifications extending into the LVOT. Annular measurements suitable for delivery of a 29 mm Edwards-SAPIEN 3 valve. 2. Sufficient coronary to annulus distance. 3. Optimum Fluoroscopic Angle for Delivery: LAO 10 CAU 7 4. No thrombus in the left atrial appendage. Electronically Signed   By: Tobias Alexander   On: 05/21/2017 16:28   Result Date: 05/21/2017 EXAM: OVER-READ INTERPRETATION  CT CHEST The following report is an over-read performed by radiologist Dr. Trudie Reed of [facility] on 05/20/2017. This over-read does not include interpretation of cardiac or coronary anatomy or pathology. The coronary calcium score/coronary CTA interpretation by the cardiologist is attached. COMPARISON:  None. FINDINGS: Extracardiac findings will be described separately under dictation for contemporaneously obtained CTA chest, abdomen and pelvis. IMPRESSION: Please see separate dictation for contemporaneously obtained CTA chest, abdomen and pelvis dated 05/20/2017 for full description of relevant extracardiac findings. Electronically Signed: By: Trudie Reed M.D. On: 05/20/2017 13:56     Dg Chest Port 1 View  Result Date: 06/03/2017 CLINICAL DATA:  Status post TAVR EXAM: PORTABLE CHEST 1 VIEW COMPARISON:  05/28/2017 FINDINGS: There is a right jugular central venous catheter with the tip projecting over the SVC. There is bilateral mild interstitial thickening likely reflecting interstitial edema. There is no focal consolidation. There is no pleural effusion or pneumothorax. The heart and mediastinal contours are unremarkable. There is evidence of interval TAVR. The osseous structures are unremarkable. IMPRESSION: 1. Mild interstitial edema. 2. Interval TAVR. 3. Right jugular central venous catheter with the tip projecting over the SVC. Electronically Signed   By: Elige Ko   On: 06/03/2017 12:27   Ct Angio Chest Aorta W &/or Wo Contrast  Result Date: 05/20/2017 CLINICAL DATA:  72 year old male with history of severe aortic stenosis. Preprocedural study prior to potential transcatheter aortic valve replacement (TAVR) procedure. EXAM: CT ANGIOGRAPHY CHEST, ABDOMEN AND PELVIS TECHNIQUE: Multidetector CT imaging through the chest, abdomen and pelvis was performed using the standard protocol during bolus administration of intravenous contrast. Multiplanar reconstructed images and MIPs were obtained and reviewed to evaluate the vascular anatomy.  CONTRAST:  <See Chart> ISOVUE-370 IOPAMIDOL (ISOVUE-370) INJECTION 76% COMPARISON:  No priors. FINDINGS: CTA CHEST FINDINGS Cardiovascular: Heart size is mildly enlarged. Concentric left ventricular hypertrophy. There is no significant pericardial fluid, thickening or pericardial calcification. There is aortic atherosclerosis, as well as atherosclerosis of the great vessels of the mediastinum and the coronary arteries, including calcified atherosclerotic plaque in the left main, left anterior descending, left circumflex and right coronary arteries. Severe thickening and calcification of the aortic valve. Moderate calcifications of the mitral annulus.  Mediastinum/Lymph Nodes: No pathologically enlarged mediastinal or hilar lymph nodes. Esophagus is unremarkable in appearance. No axillary lymphadenopathy. Lungs/Pleura: Unusual areas of thickening of the peribronchovascular interstitium with some associated peribronchovascular ground-glass attenuation and consolidative changes most evident throughout the dependent portions of the lungs bilaterally. No frank honeycombing. No definite bronchiectasis. No pleural effusions. No suspicious appearing pulmonary nodules or masses. Musculoskeletal/Soft Tissues: There are no aggressive appearing lytic or blastic lesions noted in the visualized portions of the skeleton. CTA ABDOMEN AND PELVIS FINDINGS Hepatobiliary: No cystic or solid hepatic lesions. No intra or extrahepatic biliary ductal dilatation. Status post cholecystectomy. Pancreas: No pancreatic mass. No pancreatic ductal dilatation. No pancreatic or peripancreatic fluid or inflammatory changes. Spleen: Unremarkable. Adrenals/Urinary Tract: Multiple low-attenuation lesions in both kidneys, compatible with simple cysts, measuring up to 3.9 cm in the anterior aspect of the interpolar region. Several other subcentimeter low-attenuation lesions in both kidneys are too small to definitively characterize, but are statistically likely to represent tiny cysts. Bilateral adrenal glands are normal in appearance. No hydroureteronephrosis. Urinary bladder is normal in appearance. Stomach/Bowel: The appearance of the stomach is normal. There is no pathologic dilatation of small bowel or colon. Numerous colonic diverticulae are noted, without surrounding inflammatory changes to suggest an acute diverticulitis at this time. Normal appendix. Vascular/Lymphatic: Aortic atherosclerosis, with vascular findings and measurements pertinent to potential TAVR procedure, as detailed below. No aneurysm or dissection noted in the abdominal or pelvic vasculature. The celiac axis, superior  mesenteric artery and inferior mesenteric artery are all widely patent without hemodynamically significant stenosis. Single renal arteries are both widely patent without hemodynamically significant stenosis. No lymphadenopathy noted in the abdomen or pelvis. Reproductive: Prostate gland and seminal vesicles are unremarkable in appearance. Other: No significant volume of ascites.  No pneumoperitoneum. Musculoskeletal: There are no aggressive appearing lytic or blastic lesions noted in the visualized portions of the skeleton. VASCULAR MEASUREMENTS PERTINENT TO TAVR: AORTA: Minimal Aortic Diameter-17 x 19 mm Severity of Aortic Calcification-mild RIGHT PELVIS: Right Common Iliac Artery - Minimal Diameter-13.2 x 12.5 mm Tortuosity-moderate Calcification-moderate Right External Iliac Artery - Minimal Diameter-9.1 x 9.1 mm Tortuosity-mild Calcification-none Right Common Femoral Artery - Minimal Diameter-10.0 x 10.2 mm Tortuosity-mild Calcification-mild LEFT PELVIS: Left Common Iliac Artery - Minimal Diameter-13.3 x 12.3 mm Tortuosity-severe Calcification-moderate Left External Iliac Artery - Minimal Diameter-9.1 x 9.2 mm Tortuosity-mild Calcification-none Left Common Femoral Artery - Minimal Diameter-10.4 x 9.5 mm Tortuosity-mild Calcification-mild Review of the MIP images confirms the above findings. IMPRESSION: 1. Vascular findings and measurements pertinent to potential TAVR procedure, as detailed above. 2. Severe thickening calcification of the aortic valve, compatible with the reported clinical history of severe aortic stenosis. 3. Cardiomegaly with concentric left ventricular hypertrophy. 4. Aortic atherosclerosis, in addition to left main and 3 vessel coronary artery disease. 5. Unusual appearance of the lungs which is of uncertain etiology and significance. Although a portion of these findings could be attributable to a background of some interstitial pulmonary edema, the possibility of interstitial lung disease  should be considered. Followup nonemergent outpatient high-resolution chest CT is suggested after the patient's procedure and once the patient has fully recovered to better evaluate for underlying interstitial lung disease. 6. Colonic diverticulosis without evidence of acute diverticulitis at this time. 7. Additional incidental findings, as above. Aortic Atherosclerosis (ICD10-I70.0). Electronically Signed   By: Trudie Reed M.D.   On: 05/20/2017 15:09   Ct Angio Abd/pel W/ And/or W/o  Result Date: 05/20/2017 CLINICAL DATA:  72 year old male with history of severe aortic stenosis. Preprocedural study prior to potential transcatheter aortic valve replacement (TAVR) procedure. EXAM: CT ANGIOGRAPHY CHEST, ABDOMEN AND PELVIS TECHNIQUE: Multidetector CT imaging through the chest, abdomen and pelvis was performed using the standard protocol during bolus administration of intravenous contrast. Multiplanar reconstructed images and MIPs were obtained and reviewed to evaluate the vascular anatomy. CONTRAST:  <See Chart> ISOVUE-370 IOPAMIDOL (ISOVUE-370) INJECTION 76% COMPARISON:  No priors. FINDINGS: CTA CHEST FINDINGS Cardiovascular: Heart size is mildly enlarged. Concentric left ventricular hypertrophy. There is no significant pericardial fluid, thickening or pericardial calcification. There is aortic atherosclerosis, as well as atherosclerosis of the great vessels of the mediastinum and the coronary arteries, including calcified atherosclerotic plaque in the left main, left anterior descending, left circumflex and right coronary arteries. Severe thickening and calcification of the aortic valve. Moderate calcifications of the mitral annulus. Mediastinum/Lymph Nodes: No pathologically enlarged mediastinal or hilar lymph nodes. Esophagus is unremarkable in appearance. No axillary lymphadenopathy. Lungs/Pleura: Unusual areas of thickening of the peribronchovascular interstitium with some associated peribronchovascular  ground-glass attenuation and consolidative changes most evident throughout the dependent portions of the lungs bilaterally. No frank honeycombing. No definite bronchiectasis. No pleural effusions. No suspicious appearing pulmonary nodules or masses. Musculoskeletal/Soft Tissues: There are no aggressive appearing lytic or blastic lesions noted in the visualized portions of the skeleton. CTA ABDOMEN AND PELVIS FINDINGS Hepatobiliary: No cystic or solid hepatic lesions. No intra or extrahepatic biliary ductal dilatation. Status post cholecystectomy. Pancreas: No pancreatic mass. No pancreatic ductal dilatation. No pancreatic or peripancreatic fluid or inflammatory changes. Spleen: Unremarkable. Adrenals/Urinary Tract: Multiple low-attenuation lesions in both kidneys, compatible with simple cysts, measuring up to 3.9 cm in the anterior aspect of the interpolar region. Several other subcentimeter low-attenuation lesions in both kidneys are too small to definitively characterize, but are statistically likely to represent tiny cysts. Bilateral adrenal glands are normal in appearance. No hydroureteronephrosis. Urinary bladder is normal in appearance. Stomach/Bowel: The appearance of the stomach is normal. There is no pathologic dilatation of small bowel or colon. Numerous colonic diverticulae are noted, without surrounding inflammatory changes to suggest an acute diverticulitis at this time. Normal appendix. Vascular/Lymphatic: Aortic atherosclerosis, with vascular findings and measurements pertinent to potential TAVR procedure, as detailed below. No aneurysm or dissection noted in the abdominal or pelvic vasculature. The celiac axis, superior mesenteric artery and inferior mesenteric artery are all widely patent without hemodynamically significant stenosis. Single renal arteries are both widely patent without hemodynamically significant stenosis. No lymphadenopathy noted in the abdomen or pelvis. Reproductive: Prostate  gland and seminal vesicles are unremarkable in appearance. Other: No significant volume of ascites.  No pneumoperitoneum. Musculoskeletal: There are no aggressive appearing lytic or blastic lesions noted in the visualized portions of the skeleton. VASCULAR MEASUREMENTS PERTINENT TO TAVR: AORTA: Minimal Aortic Diameter-17 x 19 mm Severity of Aortic Calcification-mild RIGHT PELVIS: Right Common Iliac Artery - Minimal Diameter-13.2 x 12.5 mm Tortuosity-moderate Calcification-moderate Right External Iliac Artery - Minimal Diameter-9.1 x 9.1 mm Tortuosity-mild Calcification-none Right Common Femoral Artery - Minimal Diameter-10.0  x 10.2 mm Tortuosity-mild Calcification-mild LEFT PELVIS: Left Common Iliac Artery - Minimal Diameter-13.3 x 12.3 mm Tortuosity-severe Calcification-moderate Left External Iliac Artery - Minimal Diameter-9.1 x 9.2 mm Tortuosity-mild Calcification-none Left Common Femoral Artery - Minimal Diameter-10.4 x 9.5 mm Tortuosity-mild Calcification-mild Review of the MIP images confirms the above findings. IMPRESSION: 1. Vascular findings and measurements pertinent to potential TAVR procedure, as detailed above. 2. Severe thickening calcification of the aortic valve, compatible with the reported clinical history of severe aortic stenosis. 3. Cardiomegaly with concentric left ventricular hypertrophy. 4. Aortic atherosclerosis, in addition to left main and 3 vessel coronary artery disease. 5. Unusual appearance of the lungs which is of uncertain etiology and significance. Although a portion of these findings could be attributable to a background of some interstitial pulmonary edema, the possibility of interstitial lung disease should be considered. Followup nonemergent outpatient high-resolution chest CT is suggested after the patient's procedure and once the patient has fully recovered to better evaluate for underlying interstitial lung disease. 6. Colonic diverticulosis without evidence of acute  diverticulitis at this time. 7. Additional incidental findings, as above. Aortic Atherosclerosis (ICD10-I70.0). Electronically Signed   By: Trudie Reed M.D.   On: 05/20/2017 15:09     Diagnostic Studies/Procedures   TAVR OPERATIVE NOTE   Date of Procedure:06/03/2017  Preoperative Diagnosis:Severe Aortic Stenosis   Procedure:   Transcatheter Aortic Valve Replacement - PercutaneousRightTransfemoral Approach Edwards Sapien 3 THV (size 29mm, model # 9600TFX, serial # [id number])  Co-Surgeons:Clarence H. Cornelius Moras, MD and Verne Carrow, MD  Pre-operative Echo Findings: ? Severe aortic stenosis ? Normalleft ventricular systolic function  Post-operative Echo Findings: ? Traceparavalvular leak ? Normalleft ventricular systolic function    _____________   Post operative echo 06/04/17 Study Conclusions - Left ventricle: The cavity size was normal. There was moderate   concentric hypertrophy. Systolic function was normal. The   estimated ejection fraction was in the range of 60% to 65%. Wall   motion was normal; there were no regional wall motion   abnormalities. Doppler parameters are consistent with abnormal   left ventricular relaxation (grade 1 diastolic dysfunction). The   E/e&' ratio is between 8-15, suggesting indeterminate LV filling   pressure. LVOT diameter (S): 27 mm. - Aortic valve: s/p Edwards Sapien 3 TAVR (29 mm) - trivial   perivalvular leak, no obstruction. Mean gradient (S): 15 mm Hg.   Peak gradient (S): 27 mm Hg. Valve area (VTI): 3.44 cm^2. Valve   area (Vmax): 3.61 cm^2. - Mitral valve: Calcified annulus. There was trivial regurgitation. - Left atrium: Severely dilated. - Inferior vena cava: The vessel was normal in size. The   respirophasic diameter changes were in the normal range (= 50%),   consistent with normal central venous pressure. Impressions: - Compared to a  study yesterday, the LVEF is higher at 60-65%. The   TAVR valve is well-seated. The gradient across the valve is   higher at 15 mmHg mean. There is a trivial perivalvular leak.   Disposition   Pt is being discharged home today in good condition.  Follow-up Plans & Appointments    Follow-up Information    Janetta Hora, PA-C Follow up on 06/11/2017.   Specialties:  Cardiology, Radiology Why:  @ 2:30pm Contact information: [address] [place] [address] [phone]            Discharge Medications     Medication List    TAKE these medications   aspirin EC 81 MG tablet Take 1  tablet (81 mg total) by mouth daily. What changed:  when to take this   clonazePAM 0.5 MG tablet Commonly known as:  KLONOPIN Take 0.5 mg by mouth 2 (two) times daily as needed for anxiety.   clopidogrel 75 MG tablet Commonly known as:  PLAVIX Take 1 tablet (75 mg total) by mouth daily with breakfast. Start taking on:  06/06/2017   simvastatin 40 MG tablet Commonly known as:  ZOCOR Take 40 mg by mouth daily after supper.   traZODone 50 MG tablet Commonly known as:  DESYREL Take 100 mg by mouth at bedtime.         Outstanding Labs/Studies   None   Duration of Discharge Encounter   Greater than 30 minutes including physician time.  Signed, Cline Crock PA-C 06/05/2017, 9:53 AM

## 2017-06-05 NOTE — Progress Notes (Signed)
CARDIAC REHAB PHASE I   PRE:  Rate/Rhythm: 74 SR  BP:  Supine: 131/67  Sitting:   Standing:    SaO2: 97%RA  MODE:  Ambulation: 300 ft   POST:  Rate/Rhythm: 80 SR  BP:  Supine: 149/59  Sitting:   Standing:    SaO2: 96%RA [id number] Pt walked 300 ft with hand held asst. C/o legs feeling a little weak but tolerated well. To bed after walk. Wants to go home. Encouraged walking in house to get stronger. Gave heart healthy diet and encouraged watching salt. Not interested in CRP 2.   Luetta Nutting, RN BSN  06/05/2017 9:38 AM

## 2017-06-05 NOTE — Progress Notes (Signed)
Discharge instructions reviewed with patient at this time. All questions answered. Patient verbalized understanding.   K. Starr Alfa Leibensperger, RN 

## 2017-06-06 ENCOUNTER — Telehealth: Payer: Self-pay | Admitting: Physician Assistant

## 2017-06-06 NOTE — Telephone Encounter (Signed)
  [facility]   MULTIDISCIPLINARY HEART VALVE TEAM   Patient contacted regarding discharge from [facility] on 06/05/17  Patient understands to follow up with provider Carlean Jews on 6/5 at [facility].  Patient understands discharge instructions? yes Patient understands medications and regiment? yes Patient understands to bring all medications to this visit? Yes  He requested that we changed his 6/27 echo and appt, which has been changed to 6/26.  Cline Crock PA-C  [facility]

## 2017-06-10 NOTE — Progress Notes (Addendum)
[facility]ASCULAR CENTER   MULTIDISCIPLINARY HEART VALVE CLINIC                                       Cardiology Office Note    Date:  06/11/2017   ID:  Dudley MajorJames Corralejo, DOB Apr 01, 1945, MRN [id number]  PCP:  Bedelia PersonAaron, Caren T, MD  Cardiologist: Dr. Eden EmmsNishan / Dr. Clifton JamesMcAlhany & Dr. Cornelius Moraswen (TAVR)  CC: TOC follow up s/p TAVR   History of Present Illness:  Dudley MajorJames Sauceda is a 72 y.o. 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   The patient states that he was first noted to have a heart murmur several years ago by his primary care physician. He was referred to a cardiologist in [place] and was diagnosed with moderate aortic stenosis. Echocardiogram performed at [facility] [place] revealed severe aortic stenosis with normal left ventricular systolic function. Peak velocity across the aortic valve was reported 3.9 m/s corresponding to mean transvalvular gradient estimated 36 mmHg and aortic valve area calculated 0.61 cm. He was told that he might need to have surgical intervention eventually but medical follow-up was recommended. Shortly after thatthe patient was self-referred to Dr.Nishanwho happens to take care of a close friend of the patient's. A nuclear stress test was performed that revealed resting ejection fraction calculated 51% with nuclear imaging felt to be low risk for myocardial ischemia. [facility] was [facility]. Recent follow-up echocardiogram performed April 23, 2017 revealed significant progression and severity of aortic stenosis. Peak velocity across aortic valve measured 4.8 m/s corresponding to mean transvalvular gradient estimated 49 mmHg and DVI 0.21 with calculated aortic valve area 0.72 cm. Left ventricular function remain normal. The patient was subsequently referred for diagnostic  cardiac catheterization which was performed April 30, 2017 by Dr. Clifton JamesMcAlhany.Catheterization confirmed the presence of severe aortic stenosis with peak to peak and mean transvalvular gradients measured 49and 42mmHg, respectively. There was moderate multivessel coronary artery disease with moderate stenosis of the proximal and mid left anterior descending coronary artery and severe stenosis in the distal left anterior descending coronary artery. There was mild nonobstructive disease in the left circumflex and right coronary artery territories.   He underwent successful TAVR with a 29mm Edwards Sapien THVvia the TF approach on 06/03/17. Post operative echo showed EF 65% and a normally functioning TAVR valve with trivial PVL and mildly elevated transvalvular mean gradient of 15 mm Hg. He was discharged on ASA and Plavix.   Today he presents to clinic for follow up. No CP or SOB. No LE edema, orthopnea or PND. No dizziness or syncope. No blood in stool or urine. No palpitations. He asks if he can resume working at [employer].     Past Medical History:  Diagnosis Date  . Chronic lower back pain   . CKD (chronic kidney disease)   . DDD (degenerative disc disease), lumbar   . Essential tremor   . Hyperlipidemia   . Hypertension   . 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 Sarcoidosis   . Severe aortic stenosis    s/p TAVR   . Thrombocytopenia [facility](HCC)     Past Surgical  History:  Procedure Laterality Date  . CARDIAC CATHETERIZATION  04/30/2017  . COLONOSCOPY    . HAMMER TOE SURGERY     "?toe; ?side"  . INGUINAL HERNIA REPAIR Right   . INTRAOPERATIVE TRANSTHORACIC ECHOCARDIOGRAM N/A 06/03/2017   Procedure: INTRAOPERATIVE TRANSTHORACIC ECHOCARDIOGRAM;  Surgeon: Kathleene Hazel, MD;  Location: [facility] OR;  Service: Open Heart Surgery;  Laterality: N/A;  . LAPAROSCOPIC  CHOLECYSTECTOMY    . RIGHT/LEFT HEART CATH AND CORONARY ANGIOGRAPHY N/A 04/30/2017   Procedure: RIGHT/LEFT HEART CATH AND CORONARY ANGIOGRAPHY;  Surgeon: Kathleene Hazel, MD;  Location: [facility] INVASIVE CV LAB;  Service: Cardiovascular;  Laterality: N/A;  . SALIVARY STONE REMOVAL  1961   "spit gland had a stone on it"  . TRANSCATHETER AORTIC VALVE REPLACEMENT, TRANSFEMORAL N/A 06/03/2017   Procedure: TRANSCATHETER AORTIC VALVE REPLACEMENT, TRANSFEMORAL;  Surgeon: Kathleene Hazel, MD;  Location: [facility] OR;  Service: Open Heart Surgery;  Laterality: N/A;    Current Medications: Outpatient Medications Prior to Visit  Medication Sig Dispense Refill  . aspirin EC 81 MG tablet Take 81 mg by mouth as directed.    . clonazePAM (KLONOPIN) 0.5 MG tablet Take 0.5 mg by mouth 2 (two) times daily as needed for anxiety.    . clopidogrel (PLAVIX) 75 MG tablet Take 1 tablet (75 mg total) by mouth daily with breakfast. 90 tablet 2  . simvastatin (ZOCOR) 40 MG tablet Take 40 mg by mouth daily after supper.     . traZODone (DESYREL) 50 MG tablet Take 100 mg by mouth at bedtime.     Marland Kitchen aspirin EC 81 MG tablet Take 1 tablet (81 mg total) by mouth daily. (Patient not taking: Reported on 06/11/2017) 90 tablet 3   No facility-administered medications prior to visit.      Allergies:   Patient has no known allergies.   Social History   Socioeconomic History  . Marital status: Divorced    Spouse name: Not on file  . Number of children: Not on file  . Years of education: Not on file  . Highest education level: Not on file  Occupational History  . Occupation: [employer]  Social Needs  . Financial resource strain: Not on file  . Food insecurity:    Worry: Not on file    Inability: Not on file  . Transportation needs:    Medical: Not on file    Non-medical: Not on file  Tobacco Use  . Smoking status: Never Smoker  . Smokeless tobacco: Never Used  Substance and Sexual Activity  . Alcohol use: No  .  Drug use: Never  . Sexual activity: Not Currently  Lifestyle  . Physical activity:    Days per week: Not on file    Minutes per session: Not on file  . Stress: Not on file  Relationships  . Social connections:    Talks on phone: Not on file    Gets together: Not on file    Attends religious service: Not on file    Active member of club or organization: Not on file    Attends meetings of clubs or organizations: Not on file    Relationship status: Not on file  Other Topics Concern  . Not on file  Social History Narrative  . Not on file     Family History:  The patient's family history includes Breast cancer in his sister; CVA (age of onset: 50) in his father; Hypertension in his mother.      ROS:  Please see the history of present illness.    ROS All other systems reviewed and are negative.   PHYSICAL EXAM:   VS:  BP 112/68   Pulse 85   Ht 6\' 2"  (1.88 m)   Wt 193 lb 4 oz (87.7 kg)   SpO2 96%   BMI 24.81 kg/m    GEN: Well nourished, well developed, in no acute distress  HEENT: normal  Neck: no JVD, carotid bruits, or masses Cardiac: RRR; no murmurs, rubs, or gallops,no edema  Respiratory:  clear to auscultation bilaterally, normal work of breathing GI: soft, nontender, nondistended, + BS MS: no deformity or atrophy  Skin: warm and dry, no rash. Mild ecchymosis on bilateral groin sites Neuro:  Alert and Oriented x 3, Strength and sensation are intact Psych: euthymic mood, full affect   Wt Readings from Last 3 Encounters:  06/11/17 193 lb 4 oz (87.7 kg)  06/05/17 196 lb 14.4 oz (89.3 kg)  05/28/17 195 lb (88.5 kg)      Studies/Labs Reviewed:   EKG:  EKG is ordered today.  The ekg ordered today demonstrates sinus with 1st deg AV block and LBBB.  Recent Labs: 05/28/2017: ALT 11; B Natriuretic Peptide 48.6 06/04/2017: Magnesium 1.6 06/05/2017: BUN 11; Creatinine, Ser 1.47; Hemoglobin 9.5; Platelets 70; Potassium 4.4; Sodium 139   Lipid Panel No results found  for: CHOL, TRIG, HDL, CHOLHDL, VLDL, LDLCALC, LDLDIRECT  Additional studies/ records that were reviewed today include:   TAVR OPERATIVE NOTE   Date of Procedure:06/03/2017  Preoperative Diagnosis:Severe Aortic Stenosis   Procedure:   Transcatheter Aortic Valve Replacement - PercutaneousRightTransfemoral Approach Edwards Sapien 3 THV (size 29mm, model # 9600TFX, serial # [id number])  Co-Surgeons:Clarence H. Cornelius Moras, MD and Verne Carrow, MD  Pre-operative Echo Findings: ? Severe aortic stenosis ? Normalleft ventricular systolic function  Post-operative Echo Findings: ? Traceparavalvular leak ? Normalleft ventricular systolic function    _____________   Post operative echo 06/04/17 Study Conclusions - Left ventricle: The cavity size was normal. There was moderate concentric hypertrophy. Systolic function was normal. The estimated ejection fraction was in the range of 60% to 65%. Wall motion was normal; there were no regional wall motion abnormalities. Doppler parameters are consistent with abnormal left ventricular relaxation (grade 1 diastolic dysfunction). The E/e&' ratio is between 8-15, suggesting indeterminate LV filling pressure. LVOT diameter (S): 27 mm. - Aortic valve: s/p Edwards Sapien 3 TAVR (29 mm) - trivial perivalvular leak, no obstruction. Mean gradient (S): 15 mm Hg. Peak gradient (S): 27 mm Hg. Valve area (VTI): 3.44 cm^2. Valve area (Vmax): 3.61 cm^2. - Mitral valve: Calcified annulus. There was trivial regurgitation. - Left atrium: Severely dilated. - Inferior vena cava: The vessel was normal in size. The respirophasic diameter changes were in the normal range (= 50%), consistent with normal central venous pressure. Impressions: - Compared to a study yesterday, the LVEF is higher at 60-65%. The TAVR valve is well-seated. The gradient across  the valve is higher at 15 mmHg mean. There is a trivial perivalvular leak.   ASSESSMENT & PLAN:   Severe AS s/p TAVR: doing well. Groin sites are stable. ECG with LBBB but no high grade block. He is cleared to resume driving and all normal activities. SBE prophylaxis discussed. He doesn't want Abx right now and asks that we call it in next time. I will see him back for 1 month echo and follow up.   HTN: BP well controlled off all antihypertensives   CKD stage III: creat  baseline 1.35-1.5  Abnormal Chest CT: he says he has a long history of sarcoidosis and also inhalation exposure from when he was working on [employer] and has quite a bit of scarring in his lungs. He previously had a cough that has now resolved. I will hold off on repeating a high resolution CT given this history.   Medication Adjustments/Labs and Tests Ordered: Current medicines are reviewed at length with the patient today.  Concerns regarding medicines are outlined above.  Medication changes, Labs and Tests ordered today are listed in the Patient Instructions below. Patient Instructions  Medication Instructions:  Your provider recommends that you continue on your current medications as directed. Please refer to the Current Medication list given to you today.    Labwork: None  Testing/Procedures: No new orders  Follow-Up: Please keep your appointments for your echocardiogram and appointment with Carlean Jews, PA on 07/02/2017. Please arrive by 1:30PM for your echo. Your office visit will be shortly after your test.   Any Other Special Instructions Will Be Listed Below (If Applicable).     If you need a refill on your cardiac medications before your next appointment, please call your pharmacy.      Signed, Cline Crock, PA-C  06/11/2017 3:34 PM    [facility] [address] [place], [place], [place]  [address] Phone: [phone]; Fax: [phone]

## 2017-06-11 ENCOUNTER — Encounter: Payer: Self-pay | Admitting: Physician Assistant

## 2017-06-11 ENCOUNTER — Ambulatory Visit (INDEPENDENT_AMBULATORY_CARE_PROVIDER_SITE_OTHER): Payer: Medicare PPO | Admitting: Physician Assistant

## 2017-06-11 VITALS — BP 112/68 | HR 85 | Ht 74.0 in | Wt 193.2 lb

## 2017-06-11 DIAGNOSIS — I10 Essential (primary) hypertension: Secondary | ICD-10-CM | POA: Diagnosis not present

## 2017-06-11 DIAGNOSIS — R9389 Abnormal findings on diagnostic imaging of other specified body structures: Secondary | ICD-10-CM

## 2017-06-11 DIAGNOSIS — N189 Chronic kidney disease, unspecified: Secondary | ICD-10-CM | POA: Diagnosis not present

## 2017-06-11 DIAGNOSIS — Z952 Presence of prosthetic heart valve: Secondary | ICD-10-CM

## 2017-06-11 NOTE — Patient Instructions (Addendum)
Medication Instructions:  Your provider recommends that you continue on your current medications as directed. Please refer to the Current Medication list given to you today.    Labwork: None  Testing/Procedures: No new orders  Follow-Up: Please keep your appointments for your echocardiogram and appointment with Carlean JewsKatie Thompson, PA on 07/02/2017. Please arrive by 1:30PM for your echo. Your office visit will be shortly after your test.   Any Other Special Instructions Will Be Listed Below (If Applicable).     If you need a refill on your cardiac medications before your next appointment, please call your pharmacy.

## 2017-06-12 ENCOUNTER — Encounter: Payer: Self-pay | Admitting: Thoracic Surgery (Cardiothoracic Vascular Surgery)

## 2017-06-13 ENCOUNTER — Telehealth (HOSPITAL_COMMUNITY): Payer: Self-pay | Admitting: Cardiovascular Disease

## 2017-06-13 NOTE — Telephone Encounter (Signed)
Spoke with patient in regards to a rash which he developed on the outside of the right leg following a TAVR procedure 5/28.  I told him to monitor the area over the weekend and apply a topical cream to alleviate the itching.  He verbalized an understanding and thanked me for the advice.

## 2017-06-13 NOTE — Telephone Encounter (Signed)
New Message  Patient called in stating that he had a rash to develop on his leg, and wanted to know if this is a side effect for the catheter surgery he just had. Please advise.  Thank you.

## 2017-06-16 ENCOUNTER — Telehealth: Payer: Self-pay

## 2017-06-16 NOTE — Telephone Encounter (Signed)
Mr. Jason Anthony called over the weekend regarding nausea/ dizziness.  Returning his call to make sure he was doing well.  He stated that he felt much better and is going to walk today.  I advised his to make sure he is drinking enough fluids and taking his medications.  He stated that he was taking his medications and getting in enough fluids.  No further questions, all questions were answered.

## 2017-07-01 NOTE — Progress Notes (Signed)
[facility]   MULTIDISCIPLINARY HEART VALVE CLINIC                                       Cardiology Office Note    Date:  07/02/2017   ID:  Jason Anthony, DOB 1945/09/08, MRN [id number]  PCP:  Bedelia Person, MD  Cardiologist:  Dr. Eden Emms / Dr. Clifton Hiren & Dr. Ranae Plumber)  CC: 1 month s/p TAVR  History of Present Illness:  Jason Anthony is a 72 y.o. 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   The patient states that he was first noted to have a heart murmur several years ago by his primary care physician. He was referred to a cardiologist in [place] and was diagnosed with moderate aortic stenosis. Echocardiogram performed at [facility] [place] revealed severe aortic stenosis with normal left ventricular systolic function.Peak velocity across the aortic valve was reported 3.9 m/s corresponding to mean transvalvular gradient estimated 36 mmHg and aortic valve area calculated 0.61 cm. He was told that he might need to have surgical intervention eventually but medical follow-up was recommended. Shortly after thatthe patient was self-referred to Dr.Nishanwho happens to take care of a close friend of the patient's. A nuclear stress test was performed that revealed resting ejection fraction calculated 51% with nuclear imaging felt to be low risk for myocardial ischemia. His echocardiogram from [facility] reviewed and close follow-up was recommended. Recent follow-up echocardiogram performed April 23, 2017 revealed significant progression and severity of aortic stenosis. Peak velocity across aortic valve measured 4.8 m/s corresponding to mean transvalvular gradient estimated 49 mmHg and DVI 0.21 with calculated aortic valve area 0.72 cm. Left ventricular function remain normal. The patient was subsequently referred for diagnostic cardiac  catheterization which was performed April 30, 2017 by Dr. Clifton Medford.Catheterization confirmed the presence of severe aortic stenosis with peak to peak and mean transvalvular gradients measured 49and , respectively. There was moderate multivessel coronary artery disease with moderate stenosis of the proximal and mid left anterior descending coronary artery and severe stenosis in the distal left anterior descending coronary artery. There was mild nonobstructive disease in the left circumflex and right coronary artery territories.   He underwent successful TAVR with a 29mm Edwards Sapien THVvia the TF approach on 06/03/17. Post operative echoshowed EF 65% and a normally functioning TAVR valve with trivial PVL and mildly elevated transvalvular mean gradient of 15 mm Hg. He was discharged on ASA and Plavix.   Today he presents to clinic for follow up. No CP. He does get SOB with moderate exertion that he thinks is mildly worse since having his TAVR. However, it doesn't bother him a lot. He can sit down and rest and it resolves relatively quickly. There is likely a pulmonary component to dyspnea given sarcoidosis and pulmonary scarring. No LE edema, orthopnea or PND. No dizziness or syncope. No blood in stool or urine. No palpitations.     Past Medical History:  Diagnosis Date  . Chronic lower back pain   . CKD (chronic kidney disease)   . DDD (degenerative disc disease), lumbar   . Essential tremor   . Hyperlipidemia   . Hypertension   . 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 Sarcoidosis   . Severe aortic stenosis    s/p TAVR   . Thrombocytopenia (HCC)     Past Surgical History:  Procedure Laterality Date  . CARDIAC CATHETERIZATION  04/30/2017  . COLONOSCOPY    . HAMMER TOE SURGERY     "?toe; ?side"  . INGUINAL HERNIA REPAIR Right   . INTRAOPERATIVE  TRANSTHORACIC ECHOCARDIOGRAM N/A 06/03/2017   Procedure: INTRAOPERATIVE TRANSTHORACIC ECHOCARDIOGRAM;  Surgeon: Kathleene Hazel, MD;  Location: [facility] OR;  Service: Open Heart Surgery;  Laterality: N/A;  . LAPAROSCOPIC CHOLECYSTECTOMY    . RIGHT/LEFT HEART CATH AND CORONARY ANGIOGRAPHY N/A 04/30/2017   Procedure: RIGHT/LEFT HEART CATH AND CORONARY ANGIOGRAPHY;  Surgeon: Kathleene Hazel, MD;  Location: [facility] INVASIVE CV LAB;  Service: Cardiovascular;  Laterality: N/A;  . SALIVARY STONE REMOVAL  1961   "spit gland had a stone on it"  . TRANSCATHETER AORTIC VALVE REPLACEMENT, TRANSFEMORAL N/A 06/03/2017   Procedure: TRANSCATHETER AORTIC VALVE REPLACEMENT, TRANSFEMORAL;  Surgeon: Kathleene Hazel, MD;  Location: [facility] OR;  Service: Open Heart Surgery;  Laterality: N/A;    Current Medications: Outpatient Medications Prior to Visit  Medication Sig Dispense Refill  . aspirin EC 81 MG tablet Take 81 mg by mouth as directed.    . clonazePAM (KLONOPIN) 0.5 MG tablet Take 0.5 mg by mouth 2 (two) times daily as needed for anxiety.    . clopidogrel (PLAVIX) 75 MG tablet Take 1 tablet (75 mg total) by mouth daily with breakfast. 90 tablet 2  . simvastatin (ZOCOR) 40 MG tablet Take 40 mg by mouth daily after supper.     . traZODone (DESYREL) 50 MG tablet Take 100 mg by mouth at bedtime.      No facility-administered medications prior to visit.      Allergies:   Patient has no known allergies.   Social History   Socioeconomic History  . Marital status: Divorced    Spouse name: Not on file  . Number of children: Not on file  . Years of education: Not on file  . Highest education level: Not on file  Occupational History  . Occupation: [employer]  Social Needs  . Financial resource strain: Not on file  . Food insecurity:    Worry: Not on file    Inability: Not on file  . Transportation needs:    Medical: Not on file    Non-medical: Not on file  Tobacco Use  . Smoking status: Never  Smoker  . Smokeless tobacco: Never Used  Substance and Sexual Activity  . Alcohol use: No  . Drug use: Never  . Sexual activity: Not Currently  Lifestyle  . Physical activity:    Days per week: Not on file    Minutes per session: Not on file  . Stress: Not on file  Relationships  . Social connections:    Talks on phone: Not on file    Gets together: Not on file    Attends religious service: Not on file    Active member of club or organization: Not on file    Attends meetings of clubs or organizations: Not on file    Relationship status: Not on file  Other Topics Concern  . Not on file  Social History Narrative  . Not on file     Family History:  The patient's family history includes Breast cancer in his sister; CVA (age of onset: 7) in his father; Hypertension in his mother.  ROS:   Please see the history of present illness.    ROS All other systems reviewed and are negative.   PHYSICAL EXAM:   VS:  BP (!) 128/56   Pulse 65   Ht 6\' 2"  (1.88 m)   Wt 193 lb (87.5 kg)   SpO2 98%   BMI 24.78 kg/m    GEN: Well nourished, well developed, in no acute distress  HEENT: normal  Neck: no JVD, carotid bruits, or masses Cardiac: RRR; no murmurs, rubs, or gallops,no edema  Respiratory:  clear to auscultation bilaterally, normal work of breathing GI: soft, nontender, nondistended, + BS MS: no deformity or atrophy  Skin: warm and dry, no rash Neuro:  Alert and Oriented x 3, Strength and sensation are intact Psych: euthymic mood, full affect    Wt Readings from Last 3 Encounters:  07/02/17 193 lb (87.5 kg)  06/11/17 193 lb 4 oz (87.7 kg)  06/05/17 196 lb 14.4 oz (89.3 kg)      Studies/Labs Reviewed:   EKG:  EKG is NOT ordered today.    Recent Labs: 05/28/2017: ALT 11; B Natriuretic Peptide 48.6 06/04/2017: Magnesium 1.6 06/05/2017: BUN 11; Creatinine, Ser 1.47; Hemoglobin 9.5; Platelets 70; Potassium 4.4; Sodium 139   Lipid Panel No results found for: CHOL, TRIG,  HDL, CHOLHDL, VLDL, LDLCALC, LDLDIRECT  Additional studies/ records that were reviewed today include:  TAVR OPERATIVE NOTE   Date of Procedure:06/03/2017  Preoperative Diagnosis:Severe Aortic Stenosis   Procedure:   Transcatheter Aortic Valve Replacement - PercutaneousRightTransfemoral Approach Edwards Sapien 3 THV (size 29mm, model # 9600TFX, serial # [id number])  Co-Surgeons:Clarence H. Cornelius Moraswen, MD and Verne Carrowhristopher McAlhany, MD  Pre-operative Echo Findings: ? Severe aortic stenosis ? Normalleft ventricular systolic function  Post-operative Echo Findings: ? Traceparavalvular leak ? Normalleft ventricular systolic function    _____________   Post operative echo 06/04/17 Study Conclusions - Left ventricle: The cavity size was normal. There was moderate concentric hypertrophy. Systolic function was normal. The estimated ejection fraction was in the range of 60% to 65%. Wall motion was normal; there were no regional wall motion abnormalities. Doppler parameters are consistent with abnormal left ventricular relaxation (grade 1 diastolic dysfunction). The E/e&' ratio is between 8-15, suggesting indeterminate LV filling pressure. LVOT diameter (S): 27 mm. - Aortic valve: s/p Edwards Sapien 3 TAVR (29 mm) - trivial perivalvular leak, no obstruction. Mean gradient (S): 15 mm Hg. Peak gradient (S): 27 mm Hg. Valve area (VTI): 3.44 cm^2. Valve area (Vmax): 3.61 cm^2. - Mitral valve: Calcified annulus. There was trivial regurgitation. - Left atrium: Severely dilated. - Inferior vena cava: The vessel was normal in size. The respirophasic diameter changes were in the normal range (= 50%), consistent with normal central venous pressure. Impressions: - Compared to a study yesterday, the LVEF is higher at 60-65%. The TAVR valve is well-seated. The gradient across the valve  is higher at 15 mmHg mean. There is a trivial perivalvular leak.  _____________   2D Echo 07/01/17 (30 days s/p TAVR) Left ventricle: The cavity size was normal. Wall thickness was   increased in a pattern of mild LVH. Systolic function was normal.   The estimated ejection fraction was in the range of 60% to 65%.   Wall motion was normal; there were no regional wall motion   abnormalities. Doppler parameters are consistent with abnormal   left ventricular relaxation (grade 1 diastolic dysfunction). - Aortic valve: A TAVR 29mm Edwards Sapien 3 bioprosthesis was   present. There was trivial  perivalvular regurgitation (1 o&'clock   position). Peak velocity (S): 227 cm/s. Mean gradient (S): 12 mm   Hg. - Mitral valve: Calcified annulus. There was trivial regurgitation.   Valve area by pressure half-time: 1.96 cm^2. - Left atrium: The atrium was mildly dilated. Volume/bsa, S: 35.4   ml/m^2.   ASSESSMENT & PLAN:   Severe AS s/p TAVR: 2D ECHO today shows EF 60-65% with normally functioning TAVR valve with trivial PVL and mean gradient 12 mm Hg.  He has NYHA class II symptoms. He does get SOB with exertion still. There is likely a pulmonary component given sarcoidosis and pulmonary scarring. SBE prophylaxis discussed. Amoxil called into mail order pharmacy. Plavix can be discontinued after 6 months of therapy (end of Nov 2019). I will see him back in 1 year with an echo  HTN: BP well controlled today.   CKD stage III: creat has been stable.   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 No further work up planned. I offered to send him to pulmonary but he would like to hold off at this time.    Medication Adjustments/Labs and Tests Ordered: Current medicines are reviewed at length with the patient today.  Concerns regarding medicines are outlined above.  Medication changes, Labs and Tests ordered today are listed in the Patient Instructions  below. Patient Instructions  Medication Instructions:  Your physician has recommended you make the following change in your medication:  1-TAKE Amoxicillin 2000 mg by mouth one hour prior to dental procedure.  2-STOP Plavix after 6 months from your procedure, at the end of November.   Labwork: NONE ordered today  Testing/Procedures: NONE ordered today  Follow-Up: Your physician recommends that you schedule a follow-up appointment in: 6 months with Dr. Eden Emms.    If you need a refill on your cardiac medications before your next appointment, please call your pharmacy.      Signed, Cline Crock, PA-C  07/02/2017 4:04 PM    [facility] [address] [place], [place], [place]  [address] Phone: [phone]; Fax: [phone]

## 2017-07-02 ENCOUNTER — Ambulatory Visit (HOSPITAL_COMMUNITY): Payer: Medicare PPO | Attending: Cardiology

## 2017-07-02 ENCOUNTER — Ambulatory Visit (INDEPENDENT_AMBULATORY_CARE_PROVIDER_SITE_OTHER): Payer: Medicare PPO | Admitting: Physician Assistant

## 2017-07-02 ENCOUNTER — Encounter: Payer: Self-pay | Admitting: Physician Assistant

## 2017-07-02 ENCOUNTER — Other Ambulatory Visit: Payer: Self-pay

## 2017-07-02 VITALS — BP 128/56 | HR 65 | Ht 74.0 in | Wt 193.0 lb

## 2017-07-02 DIAGNOSIS — I129 Hypertensive chronic kidney disease with stage 1 through stage 4 chronic kidney disease, or unspecified chronic kidney disease: Secondary | ICD-10-CM | POA: Diagnosis not present

## 2017-07-02 DIAGNOSIS — I10 Essential (primary) hypertension: Secondary | ICD-10-CM | POA: Diagnosis not present

## 2017-07-02 DIAGNOSIS — I350 Nonrheumatic aortic (valve) stenosis: Secondary | ICD-10-CM | POA: Insufficient documentation

## 2017-07-02 DIAGNOSIS — Z952 Presence of prosthetic heart valve: Secondary | ICD-10-CM

## 2017-07-02 DIAGNOSIS — R9389 Abnormal findings on diagnostic imaging of other specified body structures: Secondary | ICD-10-CM | POA: Diagnosis not present

## 2017-07-02 DIAGNOSIS — E785 Hyperlipidemia, unspecified: Secondary | ICD-10-CM | POA: Diagnosis not present

## 2017-07-02 DIAGNOSIS — N189 Chronic kidney disease, unspecified: Secondary | ICD-10-CM | POA: Diagnosis not present

## 2017-07-02 MED ORDER — AMOXICILLIN 500 MG PO TABS: ORAL_TABLET | ORAL | 3 refills | Status: DC

## 2017-07-02 NOTE — Patient Instructions (Addendum)
Medication Instructions:  Your physician has recommended you make the following change in your medication:  1-TAKE Amoxicillin 2000 mg by mouth one hour prior to dental procedure.  2-STOP Plavix after 6 months from your procedure, at the end of November.   Labwork: NONE ordered today  Testing/Procedures: NONE ordered today  Follow-Up: Your physician recommends that you schedule a follow-up appointment in: 6 months with Dr. Eden EmmsNishan.    If you need a refill on your cardiac medications before your next appointment, please call your pharmacy.

## 2017-07-03 ENCOUNTER — Ambulatory Visit: Payer: Medicare PPO | Admitting: Physician Assistant

## 2017-07-03 ENCOUNTER — Telehealth: Payer: Self-pay | Admitting: Licensed Clinical Social Worker

## 2017-07-03 ENCOUNTER — Other Ambulatory Visit (HOSPITAL_COMMUNITY): Payer: Medicare PPO

## 2017-07-03 NOTE — Telephone Encounter (Signed)
CSW referred to assist patient with multiple medical bills. CSW contacted patient to discuss bills and possible assistance programs. Patient unclear if recent bill was a final bill or if still pending with insurance. Patient will explore his paperwork and will reconnect with CSW next week. He noted that he has not received a bill yet for a recent procedure. CSW will contact patient next week to discuss further. Lasandra BeechJackie Benjie Ricketson, LCSW, CCSW-MCS [phone]

## 2017-08-20 ENCOUNTER — Encounter: Payer: Self-pay | Admitting: Thoracic Surgery (Cardiothoracic Vascular Surgery)

## 2017-09-01 ENCOUNTER — Telehealth (HOSPITAL_COMMUNITY): Payer: Self-pay | Admitting: Cardiovascular Disease

## 2017-09-01 NOTE — Telephone Encounter (Signed)
New Message:    Pt wants to know if he should take an Antibiotic before he gets his teeth cleaned at the dentist?

## 2017-09-01 NOTE — Telephone Encounter (Signed)
Called patient back. Informed him he would need to take his amoxicillin before dental procedures including cleanings. Patient verbalized understanding.

## 2017-11-04 NOTE — Progress Notes (Signed)
CARDIOLOGY OFFICE NOTE  Date:  11/05/2017    [name] Date of Birth: [birth year] Medical Record #[id number]  PCP:  Bedelia Person, MD  Cardiologist:  Eden Emms    Chief Complaint  Patient presents with  . Coronary Artery Disease  . Cardiac Valve Problem    Follow up visit - seen for Dr. Eden Emms    History of Present Illness: Jason Anthony is a 72 y.o. male who presents today for a follow up visit. Seen for Dr. Eden Emms.   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   Cardiac cath 04/2017 showed moderate multivessel coronary artery disease with moderate stenosis of the proximal and mid left anterior descending coronary artery and severe stenosis in the distal left anterior descending coronary artery. There was mild nonobstructive disease in the left circumflex and right coronary artery territories.  He was found to have severe aortic stenosis with peak to peak and mean transvalvular gradients measured 49and , respectively. He underwent successful TAVR witha 29mm Edwards Sapien THVvia the TF approach on 06/03/17. Post operative echoshowed EF 65% and a normally functioning TAVR valve with trivial PVL and mildly elevated transvalvular mean gradient of 15 mm Hg. He was discharged on ASA and Plavix.   Last seen by Cline Crock, PA in June - he was doing well post TAVR. Did not some moderate DOE - may have been mildly worse since his TAVR but overall, he was felt to be doing ok. Felt to most likely also have a pulmonary component to dyspnea given sarcoidosis and pulmonary scarring.   Comes in today. Here with a friend. He is rather talkative. He continues to work about 30 some hours a week at [employer]. He tells me he feels great. No chest pain. Breathing is ok. He tells me he is not short of breath. He does bruise rather easily. He had lab with his PCP earlier this month. 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  Current platelet count is 69K. He does not see hematology. No active bleeding. Asking about a RX for his SBE. BP typically lower at home. He otherwise has no concerns.   Past Medical History:  Diagnosis Date  . Chronic lower back pain   . CKD (chronic kidney disease)   . DDD (degenerative disc disease), lumbar   . Essential tremor   . Hyperlipidemia   . Hypertension   . 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 Sarcoidosis   . Severe aortic stenosis    s/p TAVR   . Thrombocytopenia (HCC)     Past Surgical History:  Procedure Laterality Date  . CARDIAC CATHETERIZATION  04/30/2017  . COLONOSCOPY    . HAMMER TOE SURGERY     "?toe; ?side"  . INGUINAL HERNIA REPAIR Right   . INTRAOPERATIVE TRANSTHORACIC ECHOCARDIOGRAM N/A 06/03/2017   Procedure: INTRAOPERATIVE TRANSTHORACIC ECHOCARDIOGRAM;  Surgeon: Kathleene Hazel, MD;  Location: [facility] OR;  Service: Open Heart Surgery;  Laterality: N/A;  . LAPAROSCOPIC CHOLECYSTECTOMY    . RIGHT/LEFT HEART CATH AND CORONARY ANGIOGRAPHY N/A 04/30/2017   Procedure: RIGHT/LEFT HEART CATH AND CORONARY ANGIOGRAPHY;  Surgeon: Kathleene Hazel, MD;  Location: [facility] INVASIVE CV LAB;  Service: Cardiovascular;  Laterality: N/A;  . SALIVARY STONE REMOVAL  1961   "spit gland had a stone on it"  . TRANSCATHETER AORTIC VALVE  REPLACEMENT, TRANSFEMORAL N/A 06/03/2017   Procedure: TRANSCATHETER AORTIC VALVE REPLACEMENT, TRANSFEMORAL;  Surgeon: Kathleene Hazel, MD;  Location: [facility] OR;  Service: Open Heart Surgery;  Laterality: N/A;     Medications: Current Meds  Medication Sig  . amoxicillin (AMOXIL) 500 MG tablet Take 2000 mg (4 tablets) by mouth 1 hour before any dental procedure.  Marland Kitchen aspirin EC 81 MG tablet Take 81 mg by mouth as directed.  . clonazePAM (KLONOPIN) 0.5 MG tablet Take 0.5 mg by mouth 2 (two) times daily as  needed for anxiety.  . clopidogrel (PLAVIX) 75 MG tablet Take 1 tablet (75 mg total) by mouth daily with breakfast.  . simvastatin (ZOCOR) 40 MG tablet Take 40 mg by mouth daily after supper.   . traZODone (DESYREL) 50 MG tablet Take 100 mg by mouth at bedtime.   . [DISCONTINUED] amoxicillin (AMOXIL) 500 MG tablet Take 2000 mg (4 tablets) by mouth 1 hour before any dental procedure.     Allergies: No Known Allergies  Social History: The patient  reports that he has never smoked. He has never used smokeless tobacco. He reports that he does not drink alcohol or use drugs.   Family History: The patient's family history includes Breast cancer in his sister; CVA (age of onset: 26) in his father; Hypertension in his mother.   Review of Systems: Please see the history of present illness.   Otherwise, the review of systems is positive for none.   All other systems are reviewed and negative.   Physical Exam: VS:  BP (!) 160/70 (BP Location: Left Arm, Patient Position: Sitting, Cuff Size: Normal)   Pulse (!) 56   Ht 6\' 2"  (1.88 m)   Wt 192 lb 1.9 oz (87.1 kg)   SpO2 96% Comment: at rest  BMI 24.67 kg/m  .  BMI Body mass index is 24.67 kg/m.  Wt Readings from Last 3 Encounters:  11/05/17 192 lb 1.9 oz (87.1 kg)  07/02/17 193 lb (87.5 kg)  06/11/17 193 lb 4 oz (87.7 kg)   BP is 130/80 by me.   General: Pleasant. Alert and in no acute distress.  He is rather tall.  HEENT: Normal.  Neck: Supple, no JVD, carotid bruits, or masses noted.  Cardiac: Regular rate and rhythm. Very soft outflow murmur noted. No edema.  Respiratory:  Lungs are clear to auscultation bilaterally with normal work of breathing.  GI: Soft and nontender.  MS: No deformity or atrophy. Gait and ROM intact.  Skin: Warm and dry. Color is normal.  Neuro:  Strength and sensation are intact and no gross focal deficits noted.  Psych: Alert, appropriate and with normal affect.   LABORATORY DATA:  EKG:  EKG is not  ordered today.  Lab Results  Component Value Date   WBC 4.9 06/05/2017   HGB 9.5 (L) 06/05/2017   HCT 28.9 (L) 06/05/2017   PLT 70 (L) 06/05/2017   GLUCOSE 88 06/05/2017   ALT 11 (L) 05/28/2017   AST 22 05/28/2017   NA 139 06/05/2017   K 4.4 06/05/2017   CL 105 06/05/2017   CREATININE 1.47 (H) 06/05/2017   BUN 11 06/05/2017   CO2 29 06/05/2017   INR 1.26 06/03/2017   HGBA1C 5.0 05/28/2017     BNP (last 3 results) Recent Labs    05/28/17 1023  BNP 48.6    ProBNP (last 3 results) No results for input(s): PROBNP in the last 8760 hours.   Other Studies Reviewed Today:  2D  Echo 07/01/17 (30 days s/p TAVR) Left ventricle: The cavity size was normal. Wall thickness was increased in a pattern of mild LVH. Systolic function was normal. The estimated ejection fraction was in the range of 60% to 65%. Wall motion was normal; there were no regional wall motion abnormalities. Doppler parameters are consistent with abnormal left ventricular relaxation (grade 1 diastolic dysfunction). - Aortic valve: A TAVR 29mm Edwards Sapien 3 bioprosthesis was present. There was trivial perivalvular regurgitation (1 o&'clock position). Peak velocity (S): 227 cm/s. Mean gradient (S): 12 mm Hg. - Mitral valve: Calcified annulus. There was trivial regurgitation. Valve area by pressure half-time: 1.96 cm^2. - Left atrium: The atrium was mildly dilated. Volume/bsa, S: 35.4 ml/m^2.   Post operative echo 06/04/17 Study Conclusions - Left ventricle: The cavity size was normal. There was moderate concentric hypertrophy. Systolic function was normal. The estimated ejection fraction was in the range of 60% to 65%. Wall motion was normal; there were no regional wall motion abnormalities. Doppler parameters are consistent with abnormal left ventricular relaxation (grade 1 diastolic dysfunction). The E/e&' ratio is between 8-15, suggesting indeterminate LV  filling pressure. LVOT diameter (S): 27 mm. - Aortic valve: s/p Edwards Sapien 3 TAVR (29 mm) - trivial perivalvular leak, no obstruction. Mean gradient (S): 15 mm Hg. Peak gradient (S): 27 mm Hg. Valve area (VTI): 3.44 cm^2. Valve area (Vmax): 3.61 cm^2. - Mitral valve: Calcified annulus. There was trivial regurgitation. - Left atrium: Severely dilated. - Inferior vena cava: The vessel was normal in size. The respirophasic diameter changes were in the normal range (= 50%), consistent with normal central venous pressure. Impressions: - Compared to a study yesterday, the LVEF is higher at 60-65%. The TAVR valve is well-seated. The gradient across the valve is higher at 15 mmHg mean. There is a trivial perivalvular leak.  _____________   TAVR OPERATIVE NOTE   Date of Procedure:06/03/2017  Procedure:   Transcatheter Aortic Valve Replacement - PercutaneousRightTransfemoral Approach Edwards Sapien 3 THV (size 29mm, model # 9600TFX, serial # [id number])  Co-Surgeons:Clarence H. Cornelius Moras, MD and Verne Carrow, MD  Pre-operative Echo Findings: ? Severe aortic stenosis ? Normalleft ventricular systolic function  Post-operative Echo Findings: ? Traceparavalvular leak ? Normalleft ventricular systolic function  _____________    RIGHT/LEFT HEART CATH AND CORONARY ANGIOGRAPHY 04/2017  Conclusion     Ost RCA to Prox RCA lesion is 30% stenosed.  Prox RCA to Mid RCA lesion is 20% stenosed.  Ost RPDA lesion is 30% stenosed.  Prox Cx lesion is 60% stenosed.  Dist LAD lesion is 80% stenosed.  Prox LAD lesion is 50% stenosed.  Prox LAD to Mid LAD lesion is 30% stenosed.  Ost 1st Diag lesion is 60% stenosed.  There is severe aortic valve stenosis.  Ost 2nd Mrg lesion is 30% stenosed.  Mid Cx lesion is 30% stenosed.   1. The Left main has no obstructive disease 2. The LAD is a  large caliber vessel in the proximal and mid vessel and then becomes smaller in caliber in the distal vessel. The mid LAD has a moderate stenosis that does not appear to be flow limiting. The distal LAD is smaller in caliber. There is a focal severe stenosis in the distal LAD. This vessel appears to be too small for stenting.  3. The Circumflex is a large caliber vessel with a moderate proximal stenosis just before the takeoff of a large obtuse marginal branch. The large caliber first obtuse marginal branch is free of obstructive disease.  4. The RCA is a large, dominant vessel with mild ostial stenosis, mild disease in the mid and distal vessel. There is mild disease in the moderate caliber PDA.  5. Severe aortic stenosis (mean gradient 42.1 mmHg, peak to peak gradient 49 mmHg, AVA 1.2 cm2).   Recommendations: I think his CAD can be managed at this time with medical therapy. He is having no angina. The LAD lesion is distal and the vessel is small in caliber (not favorable for stenting) and this only supplies the apical segment of myocardium. The Circumflex lesion is moderate. Will proceed with workup for AVR vs TAVR. He has an appointment with Dr. Cornelius Moras next week. I suspect that he will be a good TAVR candidate.       Assessment/Plan:  1. Prior TAVR - to complete 6 months of Plavix - this will be complete next month. He is to be on SBE per the structural heart team - written RX given to him.   2. CAD - managed medically - he has no active symptoms.   3. HTN - recheck by me is better. No changes made at this time. Would monitor.   4. CKD - stable - most recent lab noted.   5. 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 He has not wanted to follow up with pulmonary. He tells me today he is not short of breath.   6. Thrombocytopenia - I see this prior to his TAVR - this has progressed since - may need hematology referral if fails to improve - would defer to  his PCP. Stopping Plavix next month.    Current medicines are reviewed with the patient today.  The patient does not have concerns regarding medicines other than what has been noted above.  The following changes have been made:  See above.  Labs/ tests ordered today include:   No orders of the defined types were placed in this encounter.    Disposition:   FU with Dr. Eden Emms in January/February. He is to see the structural heart team next spring with repeat echo. Repeat lab if has not done prior to next visit.    Patient is agreeable to this plan and will call if any problems develop in the interim.   SignedNorma Fredrickson, NP  11/05/2017 4:17 PM  [facility] [address] [place], [place]  [address] Phone: [phone] Fax: [phone]

## 2017-11-05 ENCOUNTER — Encounter: Payer: Self-pay | Admitting: Nurse Practitioner

## 2017-11-05 ENCOUNTER — Ambulatory Visit: Payer: Medicare PPO | Admitting: Nurse Practitioner

## 2017-11-05 VITALS — BP 160/70 | HR 56 | Ht 74.0 in | Wt 192.1 lb

## 2017-11-05 DIAGNOSIS — N189 Chronic kidney disease, unspecified: Secondary | ICD-10-CM

## 2017-11-05 DIAGNOSIS — I259 Chronic ischemic heart disease, unspecified: Secondary | ICD-10-CM

## 2017-11-05 DIAGNOSIS — I10 Essential (primary) hypertension: Secondary | ICD-10-CM

## 2017-11-05 DIAGNOSIS — Z952 Presence of prosthetic heart valve: Secondary | ICD-10-CM | POA: Diagnosis not present

## 2017-11-05 MED ORDER — AMOXICILLIN 500 MG PO TABS
ORAL_TABLET | ORAL | 3 refills | Status: DC
Start: 2017-11-05 — End: 2018-07-29

## 2017-11-05 NOTE — Patient Instructions (Addendum)
We will be checking the following labs today - NONE   Medication Instructions:    Continue with your current medicines. BUT  You may stop Plavix after December 04, 2017. Continue aspirin.   I am going to give you a RX for your amoxicillin   If you need a refill on your cardiac medications before your next appointment, please call your pharmacy.     Testing/Procedures To Be Arranged:  N/A  Follow-Up:   See Dr. Eden Emms in January/February   At [facility], you and your health needs are our priority.  As part of our continuing mission to provide you with exceptional heart care, we have created designated Provider Care Teams.  These Care Teams include your primary Cardiologist (physician) and Advanced Practice Providers (APPs -  Physician Assistants and Nurse Practitioners) who all work together to provide you with the care you need, when you need it.  Special Instructions:  . None  Call the [facility] office at [phone] if you have any questions, problems or concerns.

## 2018-01-26 NOTE — Progress Notes (Signed)
CARDIOLOGY OFFICE NOTE  Date:  01/28/2018    Jason Anthony Date of Birth: 1945/04/22 Medical Record #[id number]  PCP:  Bedelia Person, MD  Cardiologist:  Eden Emms    No chief complaint on file.   History of Present Illness:  73 y.o. f/u HTN, HLD, Sarcoidosis, CKD, Tremor and TAVR  Severe AS with mean gradient 42 mmHg normal EF. No significant CAD at cath 29 mm Sapien 3 Valve placed 06/03/17 with post procedure TTE normal EF and trivial PVL. Off plavix at this time With known thrombocytopenia    Continues to work about 30 some hours a week at [employer]. He does bruise rather easily. But less off plavix     Past Medical History:  Diagnosis Date  . Chronic lower back pain   . CKD (chronic kidney disease)   . DDD (degenerative disc disease), lumbar   . Essential tremor   . Hyperlipidemia   . Hypertension   . 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 Sarcoidosis   . Severe aortic stenosis    s/p TAVR   . Thrombocytopenia (HCC)     Past Surgical History:  Procedure Laterality Date  . CARDIAC CATHETERIZATION  04/30/2017  . COLONOSCOPY    . HAMMER TOE SURGERY     "?toe; ?side"  . INGUINAL HERNIA REPAIR Right   . INTRAOPERATIVE TRANSTHORACIC ECHOCARDIOGRAM N/A 06/03/2017   Procedure: INTRAOPERATIVE TRANSTHORACIC ECHOCARDIOGRAM;  Surgeon: Kathleene Hazel, MD;  Location: [facility] OR;  Service: Open Heart Surgery;  Laterality: N/A;  . LAPAROSCOPIC CHOLECYSTECTOMY    . RIGHT/LEFT HEART CATH AND CORONARY ANGIOGRAPHY N/A 04/30/2017   Procedure: RIGHT/LEFT HEART CATH AND CORONARY ANGIOGRAPHY;  Surgeon: Kathleene Hazel, MD;  Location: [facility] INVASIVE CV LAB;  Service: Cardiovascular;  Laterality: N/A;  . SALIVARY STONE REMOVAL  1961   "spit gland had a stone on it"  . TRANSCATHETER AORTIC VALVE REPLACEMENT, TRANSFEMORAL N/A 06/03/2017   Procedure:  TRANSCATHETER AORTIC VALVE REPLACEMENT, TRANSFEMORAL;  Surgeon: Kathleene Hazel, MD;  Location: [facility] OR;  Service: Open Heart Surgery;  Laterality: N/A;     Medications: Current Meds  Medication Sig  . amoxicillin (AMOXIL) 500 MG tablet Take 2000 mg (4 tablets) by mouth 1 hour before any dental procedure.  Marland Kitchen aspirin EC 81 MG tablet Take 81 mg by mouth as directed.  . clonazePAM (KLONOPIN) 0.5 MG tablet Take 0.5 mg by mouth 2 (two) times daily as needed for anxiety.  . sertraline (ZOLOFT) 50 MG tablet Take by mouth daily.  . simvastatin (ZOCOR) 40 MG tablet Take 40 mg by mouth daily after supper.   . traZODone (DESYREL) 50 MG tablet Take 100 mg by mouth at bedtime.   . [DISCONTINUED] clopidogrel (PLAVIX) 75 MG tablet Take 1 tablet (75 mg total) by mouth daily with breakfast.     Allergies: No Known Allergies  Social History: The patient  reports that he has never smoked. He has never used smokeless tobacco. He reports that he does not drink alcohol or use drugs.   Family History: The patient's family history includes Breast cancer in his sister; CVA (age of onset: 9) in his father; Hypertension in his mother.   Review of Systems: Please see the history of present illness.   Otherwise, the review of systems is positive for none.   All other systems are reviewed and negative.   Physical Exam:  VS:  BP 120/70   Pulse 80   Ht 6\' 2"  (1.88 m)   Wt 187 lb 12.8 oz (85.2 kg)   SpO2 97%   BMI 24.11 kg/m  .  BMI Body mass index is 24.11 kg/m.  Wt Readings from Last 3 Encounters:  01/28/18 187 lb 12.8 oz (85.2 kg)  11/05/17 192 lb 1.9 oz (87.1 kg)  07/02/17 193 lb (87.5 kg)   Affect appropriate Healthy:  appears stated age HEENT: normal Neck supple with no adenopathy JVP normal no bruits no thyromegaly Lungs clear with no wheezing and good diaphragmatic motion Heart:  S1/S2 SEM thorough  TAVR valve  murmur, no rub, gallop or click PMI normal Abdomen: benighn, BS positve,  no tenderness, no AAA no bruit.  No HSM or HJR Distal pulses intact with no bruits No edema Neuro non-focal Skin warm and dry No muscular weakness   LABORATORY DATA:  EKG:  EKG is not ordered today.  Lab Results  Component Value Date   WBC 4.9 06/05/2017   HGB 9.5 (L) 06/05/2017   HCT 28.9 (L) 06/05/2017   PLT 70 (L) 06/05/2017   GLUCOSE 88 06/05/2017   ALT 11 (L) 05/28/2017   AST 22 05/28/2017   NA 139 06/05/2017   K 4.4 06/05/2017   CL 105 06/05/2017   CREATININE 1.47 (H) 06/05/2017   BUN 11 06/05/2017   CO2 29 06/05/2017   INR 1.26 06/03/2017   HGBA1C 5.0 05/28/2017     BNP (last 3 results) Recent Labs    05/28/17 1023  BNP 48.6    ProBNP (last 3 results) No results for input(s): PROBNP in the last 8760 hours.   Other Studies Reviewed Today:  2D Echo 07/01/17 (30 days s/p TAVR) Left ventricle: The cavity size was normal. Wall thickness was increased in a pattern of mild LVH. Systolic function was normal. The estimated ejection fraction was in the range of 60% to 65%. Wall motion was normal; there were no regional wall motion abnormalities. Doppler parameters are consistent with abnormal left ventricular relaxation (grade 1 diastolic dysfunction). - Aortic valve: A TAVR 29mm Edwards Sapien 3 bioprosthesis was present. There was trivial perivalvular regurgitation (1 o&'clock position). Peak velocity (S): 227 cm/s. Mean gradient (S): 12 mm Hg. - Mitral valve: Calcified annulus. There was trivial regurgitation. Valve area by pressure half-time: 1.96 cm^2. - Left atrium: The atrium was mildly dilated. Volume/bsa, S: 35.4 ml/m^2.   Post operative echo 06/04/17 Study Conclusions - Left ventricle: The cavity size was normal. There was moderate concentric hypertrophy. Systolic function was normal. The estimated ejection fraction was in the range of 60% to 65%. Wall motion was normal; there were no regional wall  motion abnormalities. Doppler parameters are consistent with abnormal left ventricular relaxation (grade 1 diastolic dysfunction). The E/e&' ratio is between 8-15, suggesting indeterminate LV filling pressure. LVOT diameter (S): 27 mm. - Aortic valve: s/p Edwards Sapien 3 TAVR (29 mm) - trivial perivalvular leak, no obstruction. Mean gradient (S): 15 mm Hg. Peak gradient (S): 27 mm Hg. Valve area (VTI): 3.44 cm^2. Valve area (Vmax): 3.61 cm^2. - Mitral valve: Calcified annulus. There was trivial regurgitation. - Left atrium: Severely dilated. - Inferior vena cava: The vessel was normal in size. The respirophasic diameter changes were in the normal range (= 50%), consistent with normal central venous pressure. Impressions: - Compared to a study yesterday, the LVEF is higher at 60-65%. The TAVR valve is well-seated. The gradient across the valve is higher at  15 mmHg mean. There is a trivial perivalvular leak.  _____________   TAVR OPERATIVE NOTE   Date of Procedure:06/03/2017  Procedure:   Transcatheter Aortic Valve Replacement - PercutaneousRightTransfemoral Approach Edwards Sapien 3 THV (size 29mm, model # 9600TFX, serial # [id number])  Co-Surgeons:Clarence H. Cornelius Moraswen, MD and Verne Carrowhristopher McAlhany, MD  Pre-operative Echo Findings: ? Severe aortic stenosis ? Normalleft ventricular systolic function  Post-operative Echo Findings: ? Traceparavalvular leak ? Normalleft ventricular systolic function  _____________    RIGHT/LEFT HEART CATH AND CORONARY ANGIOGRAPHY 04/2017  Conclusion     Ost RCA to Prox RCA lesion is 30% stenosed.  Prox RCA to Mid RCA lesion is 20% stenosed.  Ost RPDA lesion is 30% stenosed.  Prox Cx lesion is 60% stenosed.  Dist LAD lesion is 80% stenosed.  Prox LAD lesion is 50% stenosed.  Prox LAD to Mid LAD lesion is 30% stenosed.  Ost 1st Diag  lesion is 60% stenosed.  There is severe aortic valve stenosis.  Ost 2nd Mrg lesion is 30% stenosed.  Mid Cx lesion is 30% stenosed.   1. The Left main has no obstructive disease 2. The LAD is a large caliber vessel in the proximal and mid vessel and then becomes smaller in caliber in the distal vessel. The mid LAD has a moderate stenosis that does not appear to be flow limiting. The distal LAD is smaller in caliber. There is a focal severe stenosis in the distal LAD. This vessel appears to be too small for stenting.  3. The Circumflex is a large caliber vessel with a moderate proximal stenosis just before the takeoff of a large obtuse marginal branch. The large caliber first obtuse marginal branch is free of obstructive disease.  4. The RCA is a large, dominant vessel with mild ostial stenosis, mild disease in the mid and distal vessel. There is mild disease in the moderate caliber PDA.  5. Severe aortic stenosis (mean gradient 42.1 mmHg, peak to peak gradient 49 mmHg, AVA 1.2 cm2).   Recommendations: I think his CAD can be managed at this time with medical therapy. He is having no angina. The LAD lesion is distal and the vessel is small in caliber (not favorable for stenting) and this only supplies the apical segment of myocardium. The Circumflex lesion is moderate. Will proceed with workup for AVR vs TAVR. He has an appointment with Dr. Cornelius Moraswen next week. I suspect that he will be a good TAVR candidate.       Assessment/Plan:  1. Prior TAVR - off plavix SBE prophylaxis TTE 07/02/17 normal 29 mm Sapien 3 bioprosthetic trivial PVL mean Gradient 12 mm Hg stable   2. CAD - managed medically - he has no active symptoms.   3. HTN - Well controlled.  Continue current medications and low sodium Dash type diet.    4. CKD - stable - most recent lab noted. Cr 1.36 October 2019   5. 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 He has not wanted to follow up with  pulmonary.   6. Thrombocytopenia - I see this prior to his TAVR - this has progressed since - may need hematology referral if fails to improve - would defer to his PCP. Off plavix  PLT 69 October 2019   Current medicines are reviewed with the patient today.  The patient does not have concerns regarding medicines other than what has been noted above.  The following changes have been made:  See  above.  Labs/ tests ordered today include:   No orders of the defined types were placed in this encounter.    Disposition:   FU cardiology one year    Patient is agreeable to this plan and will call if any problems develop in the interim.   Signed: Charlton Haws, MD  01/28/2018 2:04 PM  [facility] [address] [place], [place]  [address] Phone: [phone] Fax: [phone]

## 2018-01-28 ENCOUNTER — Ambulatory Visit: Payer: Medicare PPO | Admitting: Cardiovascular Disease

## 2018-01-28 ENCOUNTER — Encounter: Payer: Self-pay | Admitting: Cardiovascular Disease

## 2018-01-28 DIAGNOSIS — D696 Thrombocytopenia, unspecified: Secondary | ICD-10-CM

## 2018-01-28 DIAGNOSIS — N189 Chronic kidney disease, unspecified: Secondary | ICD-10-CM

## 2018-01-28 DIAGNOSIS — I10 Essential (primary) hypertension: Secondary | ICD-10-CM

## 2018-01-28 DIAGNOSIS — Z952 Presence of prosthetic heart valve: Secondary | ICD-10-CM

## 2018-01-28 DIAGNOSIS — R9389 Abnormal findings on diagnostic imaging of other specified body structures: Secondary | ICD-10-CM | POA: Diagnosis not present

## 2018-01-28 DIAGNOSIS — I2510 Atherosclerotic heart disease of native coronary artery without angina pectoris: Secondary | ICD-10-CM

## 2018-01-28 NOTE — Patient Instructions (Addendum)
Medication Instructions:   If you need a refill on your cardiac medications before your next appointment, please call your pharmacy.   Lab work:  If you have labs (blood work) drawn today and your tests are completely normal, you will receive your results only by: Marland Kitchen. MyChart Message (if you have MyChart) OR . A paper copy in the mail If you have any lab test that is abnormal or we need to change your treatment, we will call you to review the results.  Testing/Procedures: Your physician has requested that you have an echocardiogram in 6 months. Echocardiography is a painless test that uses sound waves to create images of your heart. It provides your doctor with information about the size and shape of your heart and how well your heart's chambers and valves are working. This procedure takes approximately one hour. There are no restrictions for this procedure.  Follow-Up: At [facility], you and your health needs are our priority.  As part of our continuing mission to provide you with exceptional heart care, we have created designated Provider Care Teams.  These Care Teams include your primary Cardiologist (physician) and Advanced Practice Providers (APPs -  Physician Assistants and Nurse Practitioners) who all work together to provide you with the care you need, when you need it. Your physician recommends that you schedule a follow-up appointment in: 6 months with Carlean JewsKatie Thompson PA.  You will need a follow up appointment in 12 months.  Please call our office 2 months in advance to schedule this appointment.  You may see Dr. Eden EmmsNishan or one of the following Advanced Practice Providers on your designated Care Team:   Norma FredricksonLori Gerhardt, NP Nada BoozerLaura Ingold, NP . Georgie ChardJill McDaniel, NP

## 2018-04-16 ENCOUNTER — Telehealth: Payer: Self-pay

## 2018-04-16 NOTE — Telephone Encounter (Signed)
I called Jason Anthony to schedule his 1 yr TAVR visit with Carlean Jews. He stated that he didn't think a phone visit would do any good. He wasn't interested in trying the Video visit (even though I told him it was very simple). He is already scheduled for his Echo on 07/29/2018 @2 :00, which was ordered by Dr Eden Emms.  He recently saw his PCP due to not feeling well and they ordered him to have a CT scan done at the hospital but they have put that on hold due to COVID-19. He stated that his PCP thinks something could be going on with either his Kidneys or Liver. He has lost over 50lbs since 06/2017. He feels like this scan should not be put off and wants you to be aware of this.  Jason Anthony would like to schedule his appt when he can come in the office if you think that it's ok for him to wait. He said you could call him if you need to talk to him about this. I told him I could schedule a time for you to call but he didn't want to do that because of his work schedule. He works at [employer] and said they are working him 40+ hours/week.

## 2018-04-17 NOTE — Telephone Encounter (Signed)
Thank  You for trying April. I was able to get him to agree to a phone visit next Tuesday which has been scheduled.   CONSENT FOR TELE-HEALTH VISIT - PLEASE REVIEW  I hereby voluntarily request, consent and authorize [facility] and its employed or contracted physicians, physician assistants, nurse practitioners or other licensed health care professionals (the Practitioner), to provide me with telemedicine health care services (the "Services") as deemed necessary by the treating Practitioner. I acknowledge and consent to receive the Services by the Practitioner via telemedicine. I understand that the telemedicine visit will involve communicating with the Practitioner through live audiovisual communication technology and the disclosure of certain medical information by electronic transmission. I acknowledge that I have been given the opportunity to request an in-person assessment or other available alternative prior to the telemedicine visit and am voluntarily participating in the telemedicine visit.  I understand that I have the right to withhold or withdraw my consent to the use of telemedicine in the course of my care at any time, without affecting my right to future care or treatment, and that the Practitioner or I may terminate the telemedicine visit at any time. I understand that I have the right to inspect all information obtained and/or recorded in the course of the telemedicine visit and may receive copies of available information for a reasonable fee.  I understand that some of the potential risks of receiving the Services via telemedicine include:  Marland Kitchen Delay or interruption in medical evaluation due to technological equipment failure or disruption; . Information transmitted may not be sufficient (e.g. poor resolution of images) to allow for appropriate medical decision making by the Practitioner; and/or  . In rare instances, security protocols could fail, causing a breach of personal health  information.  Furthermore, I acknowledge that it is my responsibility to provide information about my medical history, conditions and care that is complete and accurate to the best of my ability. I acknowledge that Practitioner's advice, recommendations, and/or decision may be based on factors not within their control, such as incomplete or inaccurate data provided by me or distortions of diagnostic images or specimens that may result from electronic transmissions. I understand that the practice of medicine is not an exact science and that Practitioner makes no warranties or guarantees regarding treatment outcomes. I acknowledge that I will receive a copy of this consent concurrently upon execution via email to the email address I last provided but may also request a printed copy by calling the office of [facility].    I understand that my insurance will be billed for this visit.   I have read or had this consent read to me. . I understand the contents of this consent, which adequately explains the benefits and risks of the Services being provided via telemedicine.  . I have been provided ample opportunity to ask questions regarding this consent and the Services and have had my questions answered to my satisfaction. . I give my informed consent for the services to be provided through the use of telemedicine in my medical care  By participating in this telemedicine visit I agree to the above.

## 2018-04-20 NOTE — Progress Notes (Addendum)
[facility]   MULTIDISCIPLINARY HEART VALVE TEAM   Evaluation Performed:  Follow-up visit  This visit type was conducted due to national recommendations for restrictions regarding the COVID-19 Pandemic (e.g. social distancing).  This format is felt to be most appropriate for this patient at this time.  All issues noted in this document were discussed and addressed.  No physical exam was performed (except for noted visual exam findings with Telehealth visits).  The patient has consented to conduct a Telehealth visit and understands insurance will be billed. Please see consent in telephone visit on 04/16/18.  Date:  04/21/2018   ID:  Jason Anthony, DOB Jan 17, 1945, MRN [id number]  Patient Location:  [address] [place] [address]   Provider location:   [address] [place], [place] [address]  PCP:  Bedelia Person, MD  Cardiologist:  Dr. Eden Emms / Dr. Clifton Finnbar & Dr. Ranae Plumber)   Chief Complaint:  1 year s/p TAVR  History of Present Illness:    Jason Anthony is a 73 y.o. 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    The patient does not have symptoms concerning for COVID-19 infection (fever, chills, cough, or new SHORTNESS OF BREATH).   He underwent successful TAVR witha 29mm Edwards Sapien THVvia the TF approach on 06/03/17. Post operative echoshowed EF 65% and a normally functioning TAVR valve with trivial PVL and mildly elevated transvalvular mean gradient of 15 mm Hg. He was discharged on ASA and Plavix. 1 month echo showed EF 60-65% with normally functioning TAVR valve with trivial PVL and mean gradient 12 mm Hg.  He had NYHA class II symptoms at that time with continued dyspnea on exertion still. There was felt to be a pulmonary component given sarcoidosis and pulmonary scarring. He was reluctant to see pulmonary for  evaluation.  Today he presents for telehealth visit. No CP. He has chronic SOB that he attributes to his lung disease that is unchanged. No LE edema, orthopnea or PND. No dizziness or syncope. No blood in stool or urine. No palpitations. He has had some fatigue recently attributed to working long hours at [employer] with Covid 19 pandemic. He also admits to some unintentional weight loss recently for which he is seeing his PCP. A CT scan was planned but got pushed out due to pandemic.    Prior CV studies:   The following studies were reviewed today:  TAVR OPERATIVE NOTE   Date of Procedure:06/03/2017  Preoperative Diagnosis:Severe Aortic Stenosis   Procedure:   Transcatheter Aortic Valve Replacement  PercutaneousRightTransfemoral Approach Edwards Sapien 3 THV (size 29mm, model # 9600TFX, serial # [id number])  Co-Surgeons:Clarence H. Cornelius Moras, MD and Verne Carrow, MD  Pre-operative Echo Findings: ? Severe aortic stenosis ? Normalleft ventricular systolic function  Post-operative Echo Findings: ? Traceparavalvular leak ? Normalleft ventricular systolic function    _____________   Post operative echo 06/04/17 Study Conclusions - Left ventricle: The cavity size was normal. There was moderate concentric hypertrophy. Systolic function was normal. The estimated ejection fraction was in the range of 60% to 65%. Wall motion was normal; there were no regional wall motion abnormalities. Doppler parameters are consistent with abnormal left ventricular relaxation (grade 1 diastolic dysfunction). The E/e&' ratio is between 8-15, suggesting indeterminate LV filling pressure. LVOT diameter (S): 27 mm. - Aortic valve: s/p Edwards Sapien 3 TAVR (29 mm) - trivial perivalvular  leak, no obstruction. Mean gradient (S): 15 mm Hg. Peak gradient (S): 27 mm Hg. Valve area (VTI): 3.44 cm^2. Valve  area (Vmax): 3.61 cm^2. - Mitral valve: Calcified annulus. There was trivial regurgitation. - Left atrium: Severely dilated. - Inferior vena cava: The vessel was normal in size. The respirophasic diameter changes were in the normal range (= 50%), consistent with normal central venous pressure. Impressions: - Compared to a study yesterday, the LVEF is higher at 60-65%. The TAVR valve is well-seated. The gradient across the valve is higher at 15 mmHg mean. There is a trivial perivalvular leak.  _____________   2D Echo 07/01/17 (30 days s/p TAVR) Left ventricle: The cavity size was normal. Wall thickness was increased in a pattern of mild LVH. Systolic function was normal. The estimated ejection fraction was in the range of 60% to 65%. Wall motion was normal; there were no regional wall motion abnormalities. Doppler parameters are consistent with abnormal left ventricular relaxation (grade 1 diastolic dysfunction). - Aortic valve: A TAVR 19mm Edwards Sapien 3 bioprosthesis was present. There was trivial perivalvular regurgitation (1 o&'clock position). Peak velocity (S): 227 cm/s. Mean gradient (S): 12 mm Hg. - Mitral valve: Calcified annulus. There was trivial regurgitation. Valve area by pressure half-time: 1.96 cm^2. - Left atrium: The atrium was mildly dilated. Volume/bsa, S: 35.4 ml/m^2.   Past Medical History:  Diagnosis Date  . Chronic lower back pain   . CKD (chronic kidney disease)   . DDD (degenerative disc disease), lumbar   . Essential tremor   . Hyperlipidemia   . Hypertension   . 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 Sarcoidosis   . Severe aortic stenosis    s/p TAVR   . Thrombocytopenia (HCC)    Past Surgical History:  Procedure Laterality Date  . CARDIAC CATHETERIZATION  04/30/2017  .  COLONOSCOPY    . HAMMER TOE SURGERY     "?toe; ?side"  . INGUINAL HERNIA REPAIR Right   . INTRAOPERATIVE TRANSTHORACIC ECHOCARDIOGRAM N/A 06/03/2017   Procedure: INTRAOPERATIVE TRANSTHORACIC ECHOCARDIOGRAM;  Surgeon: Kathleene Hazel, MD;  Location[facility] OR;  Service: Open Heart Surgery;  Laterality: N/A;  . LAPAROSCOPIC CHOLECYSTECTOMY    . RIGHT/LEFT HEART CATH AND CORONARY ANGIOGRAPHY N/A 04/30/2017   Procedure: RIGHT/LEFT HEART CATH AND CORONARY ANGIOGRAPHY;  Surgeon: Kathleene Hazel, MD;  Location: [facility] INVASIVE CV LAB;  Service: Cardiovascular;  Laterality: N/A;  . SALIVARY STONE REMOVAL  1961   "spit gland had a stone on it"  . TRANSCATHETER AORTIC VALVE REPLACEMENT, TRANSFEMORAL N/A 06/03/2017   Procedure: TRANSCATHETER AORTIC VALVE REPLACEMENT, TRANSFEMORAL;  Surgeon: Kathleene Hazel, MD;  Location: [facility] OR;  Service: Open Heart Surgery;  Laterality: N/A;     Current Meds  Medication Sig  . amoxicillin (AMOXIL) 500 MG tablet Take 2000 mg (4 tablets) by mouth 1 hour before any dental procedure.  Marland Kitchen aspirin EC 81 MG tablet Take 81 mg by mouth as directed.  . clonazePAM (KLONOPIN) 0.5 MG tablet Take 0.5 mg by mouth 2 (two) times daily as needed for anxiety.  . sertraline (ZOLOFT) 50 MG tablet Take by mouth daily.  . simvastatin (ZOCOR) 40 MG tablet Take 40 mg by mouth daily after supper.   . traZODone (DESYREL) 50 MG tablet Take 100 mg by mouth at bedtime.      Allergies:   Patient has no known allergies.  Social History   Tobacco Use  . Smoking status: Never Smoker  . Smokeless tobacco: Never Used  Substance Use Topics  . Alcohol use: No  . Drug use: Never     Family Hx: The patient's family history includes Breast cancer in his sister; CVA (age of onset: 8170) in his father; Hypertension in his mother.  ROS:   Please see the history of present illness.    All other systems reviewed and are negative.   Labs/Other Tests and Data Reviewed:    Recent  Labs: 05/28/2017: ALT 11; B Natriuretic Peptide 48.6 06/04/2017: Magnesium 1.6 06/05/2017: BUN 11; Creatinine, Ser 1.47; Hemoglobin 9.5; Platelets 70; Potassium 4.4; Sodium 139   Recent Lipid Panel No results found for: CHOL, TRIG, HDL, CHOLHDL, LDLCALC, LDLDIRECT  Wt Readings from Last 3 Encounters:  01/28/18 187 lb 12.8 oz (85.2 kg)  11/05/17 192 lb 1.9 oz (87.1 kg)  07/02/17 193 lb (87.5 kg)     Exam:    There were no vitals filed for this visit.  Not completed as visit conducted over the phone  ASSESSMENT & PLAN:    Severe AS s/p TAVR: doing well with NYHA class II symptoms. He has chronic dyspnea which is unchanged and does not limit his ability to do what he wants. He will continue on aspirin 81 mg indefinitely. He has amoxicillin for SBE prophylaxis. KCCQ was completed over the phone. His echo has been moved out until July given Covid 19 pandemic. He will continue regular follow up with Dr. Eden EmmsNishan.    COVID-19 Education: The signs and symptoms of COVID-19 were discussed with the patient and how to seek care for testing (follow up with PCP or arrange E-visit).  The importance of social distancing was discussed today.  Patient Risk:   After full review of this patients clinical status, I feel that they are at least moderate risk at this time.  Time:   Today, I have spent 15 minutes with the patient with telehealth technology discussing post surgical recovery, symptoms and instructions going forward.     Medication Adjustments/Labs and Tests Ordered: Current medicines are reviewed at length with the patient today.  Concerns regarding medicines are outlined above.  Tests Ordered: No orders of the defined types were placed in this encounter.  Medication Changes: No orders of the defined types were placed in this encounter.   Disposition:  Echo In July and regular follow up with Dr. Eden EmmsNishan  Signed, Cline CrockKathryn Thompson, PA-C  04/21/2018 11:43 AM    [facility]  [facility] [address], [place], [place]  [address] Phone: [phone]; Fax: [phone]

## 2018-04-21 ENCOUNTER — Telehealth (INDEPENDENT_AMBULATORY_CARE_PROVIDER_SITE_OTHER): Payer: Medicare PPO | Admitting: Physician Assistant

## 2018-04-21 ENCOUNTER — Other Ambulatory Visit: Payer: Self-pay

## 2018-04-21 DIAGNOSIS — I350 Nonrheumatic aortic (valve) stenosis: Secondary | ICD-10-CM | POA: Diagnosis not present

## 2018-04-21 DIAGNOSIS — Z952 Presence of prosthetic heart valve: Secondary | ICD-10-CM | POA: Diagnosis not present

## 2018-04-21 DIAGNOSIS — Z7189 Other specified counseling: Secondary | ICD-10-CM

## 2018-04-21 NOTE — Patient Instructions (Signed)
Hello Jason Anthony,   It was so nice to talk to you on the phone today. I am so glad you are doing so well. I just wanted to send you a recap of our discussion.   Please continue taking antibiotics prior to any dental work including cleanings ( amoxicillin). Please continue on all your same medications, including aspirin 81 mg indefinitely.   Your 1 year echo has been rescheduled to July 22.   All appointment details are attached to this letter in your "after visit summary."  Please call us with any questions or concerns you may have and please stay safe during these uncertain times.  Carlean Jews

## 2018-07-01 ENCOUNTER — Encounter: Payer: Self-pay | Admitting: Thoracic Surgery (Cardiothoracic Vascular Surgery)

## 2018-07-28 ENCOUNTER — Other Ambulatory Visit: Payer: Self-pay | Admitting: Physician Assistant

## 2018-07-29 ENCOUNTER — Other Ambulatory Visit (HOSPITAL_COMMUNITY): Payer: Medicare PPO

## 2018-09-29 ENCOUNTER — Encounter (INDEPENDENT_AMBULATORY_CARE_PROVIDER_SITE_OTHER): Payer: Self-pay

## 2018-09-29 ENCOUNTER — Other Ambulatory Visit: Payer: Self-pay

## 2018-09-29 ENCOUNTER — Ambulatory Visit (HOSPITAL_COMMUNITY): Payer: Medicare PPO | Attending: Cardiology

## 2018-09-29 DIAGNOSIS — Z952 Presence of prosthetic heart valve: Secondary | ICD-10-CM | POA: Insufficient documentation

## 2018-10-06 ENCOUNTER — Telehealth: Payer: Self-pay | Admitting: Cardiovascular Disease

## 2018-10-06 NOTE — Telephone Encounter (Signed)
Follow Up:    Pt wants to know if his Echo result is ready from last week?

## 2018-10-06 NOTE — Telephone Encounter (Signed)
The patient has been notified of the Echo result and verbalized understanding.  All questions (if any) were answered. Frederik Schmidt, RN 10/06/2018 12:03 PM

## 2018-10-07 ENCOUNTER — Telehealth: Payer: Self-pay

## 2018-10-07 DIAGNOSIS — I359 Nonrheumatic aortic valve disorder, unspecified: Secondary | ICD-10-CM

## 2018-10-07 DIAGNOSIS — Z952 Presence of prosthetic heart valve: Secondary | ICD-10-CM

## 2018-10-07 NOTE — Telephone Encounter (Signed)
Will place order for echo. Patient aware of results.

## 2018-10-07 NOTE — Telephone Encounter (Signed)
-----   Message from Josue Hector, MD sent at 10/04/2018  9:33 AM EDT ----- TAVR valve stable since 2019 trivial Peri valvular leak stable mean gradient f/u echo in a year

## 2019-03-04 NOTE — Progress Notes (Signed)
CARDIOLOGY OFFICE NOTE  Date:  03/09/2019    Jason Anthony Date of Birth: 12/18/1945 Medical Record #[id number]  PCP:  Bedelia Person, MD  Cardiologist:  Eden Emms    No chief complaint on file.   History of Present Illness:  74 y.o. f/u HTN, HLD, Sarcoidosis, CKD, Tremor and TAVR  Severe AS with mean gradient 42 mmHg normal EF. No significant CAD at cath 29 mm Sapien 3 Valve placed 06/03/17 with post procedure TTE normal EF and trivial PVL. Off plavix at this time With known thrombocytopenia   Continues to work about 30 some hours a week at [employer]. He does bruise rather easily. But less off plavix  Echo done 09/29/18 stable mild PVL  Mean gradient peak 34 mmHg  Doing well with no syncope, dyspnea, palpitations or chest pain  Lives alone Has 2 nephews in area  Has not gotten vaccine yet Told him to look in [facility] not just [place] area   Past Medical History:  Diagnosis Date  . Chronic lower back pain   . CKD (chronic kidney disease)   . DDD (degenerative disc disease), lumbar   . Essential tremor   . Hyperlipidemia   . Hypertension   . 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 Sarcoidosis   . Severe aortic stenosis    s/p TAVR   . Thrombocytopenia (HCC)     Past Surgical History:  Procedure Laterality Date  . CARDIAC CATHETERIZATION  04/30/2017  . COLONOSCOPY    . HAMMER TOE SURGERY     "?toe; ?side"  . INGUINAL HERNIA REPAIR Right   . INTRAOPERATIVE TRANSTHORACIC ECHOCARDIOGRAM N/A 06/03/2017   Procedure: INTRAOPERATIVE TRANSTHORACIC ECHOCARDIOGRAM;  Surgeon: Kathleene Hazel, MD;  Location: [facility] OR;  Service: Open Heart Surgery;  Laterality: N/A;  . LAPAROSCOPIC CHOLECYSTECTOMY    . RIGHT/LEFT HEART CATH AND CORONARY ANGIOGRAPHY N/A 04/30/2017   Procedure: RIGHT/LEFT HEART CATH AND CORONARY ANGIOGRAPHY;   Surgeon: Kathleene Hazel, MD;  Location: [facility] INVASIVE CV LAB;  Service: Cardiovascular;  Laterality: N/A;  . SALIVARY STONE REMOVAL  1961   "spit gland had a stone on it"  . TRANSCATHETER AORTIC VALVE REPLACEMENT, TRANSFEMORAL N/A 06/03/2017   Procedure: TRANSCATHETER AORTIC VALVE REPLACEMENT, TRANSFEMORAL;  Surgeon: Kathleene Hazel, MD;  Location: [facility] OR;  Service: Open Heart Surgery;  Laterality: N/A;     Medications: No outpatient medications have been marked as taking for the 03/09/19 encounter (Office Visit) with Wendall Stade, MD.     Allergies: No Known Allergies  Social History: The patient  reports that he has never smoked. He has never used smokeless tobacco. He reports that he does not drink alcohol or use drugs.   Family History: The patient's family history includes Breast cancer in his sister; CVA (age of onset: 62) in his father; Hypertension in his mother.   Review of Systems: Please see the history of present illness.   Otherwise, the review of systems is positive for none.   All other systems are reviewed and negative.   Physical Exam: VS:  BP 118/72   Pulse 60   Ht 6\' 2"  (1.88 m)   Wt 201 lb (91.2 kg)   SpO2 98%   BMI 25.81 kg/m  .  BMI Body mass index is 25.81 kg/m.  Wt Readings from Last 3 Encounters:  03/09/19 201 lb (91.2 kg)  01/28/18 187 lb 12.8 oz (85.2 kg)  11/05/17 192 lb 1.9 oz (87.1 kg)   Affect appropriate Healthy:  appears stated age HEENT: normal Neck supple with no adenopathy JVP normal no bruits no thyromegaly Lungs clear with no wheezing and good diaphragmatic motion Heart:  S1/S2 SEM thorough  TAVR valve no AR  murmur, no rub, gallop or click PMI normal Abdomen: benighn, BS positve, no tenderness, no AAA no bruit.  No HSM or HJR Distal pulses intact with no bruits No edema Neuro non-focal Skin warm and dry No muscular weakness   LABORATORY DATA:  EKG:   SR rate 66 LBBB   Lab Results  Component Value Date    WBC 4.9 06/05/2017   HGB 9.5 (L) 06/05/2017   HCT 28.9 (L) 06/05/2017   PLT 70 (L) 06/05/2017   GLUCOSE 88 06/05/2017   ALT 11 (L) 05/28/2017   AST 22 05/28/2017   NA 139 06/05/2017   K 4.4 06/05/2017   CL 105 06/05/2017   CREATININE 1.47 (H) 06/05/2017   BUN 11 06/05/2017   CO2 29 06/05/2017   INR 1.26 06/03/2017   HGBA1C 5.0 05/28/2017     BNP (last 3 results) No results for input(s): BNP in the last 8760 hours.  ProBNP (last 3 results) No results for input(s): PROBNP in the last 8760 hours.   Other Studies Reviewed Today:  2D Echo 2018-10-12  1. Left ventricular ejection fraction, by visual estimation, is 60 to  65%. The left ventricle has normal function. Normal left ventricular size.  There is moderately increased left ventricular hypertrophy.  2. Left ventricular diastolic Doppler parameters are consistent with  impaired relaxation pattern of LV diastolic filling.  3. Global right ventricle has normal systolic function.The right  ventricular size is normal. No increase in right ventricular wall  thickness.  4. Left atrial size was moderately dilated.  5. Right atrial size was normal.  6. Moderate mitral annular calcification.  7. The mitral valve is normal in structure. Mild mitral valve  regurgitation. No evidence of mitral stenosis.  8. S/P TAVR with a 29 mm Edwards-SAPIEN 3 valve. Peak/mean transaortic  gradients are 34/18 mmHg. Mild paravalvular leak.  9. The tricuspid valve is normal in structure. Tricuspid valve  regurgitation is mild.  10. The pulmonic valve was normal in structure. Pulmonic valve  regurgitation is not visualized by color flow Doppler.  11. The inferior vena cava is normal in size with greater than 50%  respiratory variability, suggesting right atrial pressure of 3 mmHg.  12. When compared to the prior study from 07/04/2018 peak/mean transaortic  gradients are slightly increased, now 34/18 mmHg, previously mean 12 mmHg.  There  is mild paravalvular leak.   _____________   TAVR OPERATIVE NOTE   Date of Procedure:06/03/2017  Procedure:   Transcatheter Aortic Valve Replacement - PercutaneousRightTransfemoral Approach Edwards Sapien 3 THV (size 55mm, model # 9600TFX, serial # [id number])  Co-Surgeons:Clarence H. Roxy Manns, MD and Lauree Chandler, MD  Pre-operative Echo Findings: ? Severe aortic stenosis ? Normalleft ventricular systolic function  Post-operative Echo Findings: ? Traceparavalvular leak ? Normalleft ventricular systolic function  _____________    RIGHT/LEFT HEART CATH AND CORONARY ANGIOGRAPHY 04/2017  Conclusion     Ost RCA to Prox RCA lesion is 30% stenosed.  Prox RCA to Mid RCA lesion is 20% stenosed.  Ost RPDA lesion is 30% stenosed.  Prox Cx lesion is 60% stenosed.  Dist LAD lesion is 80% stenosed.  Prox LAD lesion is 50% stenosed.  Prox LAD to Mid LAD lesion is 30% stenosed.  Ost 1st Diag lesion is 60% stenosed.  There is severe aortic valve stenosis.  Ost 2nd Mrg lesion is 30% stenosed.  Mid Cx lesion is 30% stenosed.   1. The Left main has no obstructive disease 2. The LAD is a large caliber vessel in the proximal and mid vessel and then becomes smaller in caliber in the distal vessel. The mid LAD has a moderate stenosis that does not appear to be flow limiting. The distal LAD is smaller in caliber. There is a focal severe stenosis in the distal LAD. This vessel appears to be too small for stenting.  3. The Circumflex is a large caliber vessel with a moderate proximal stenosis just before the takeoff of a large obtuse marginal branch. The large caliber first obtuse marginal branch is free of obstructive disease.  4. The RCA is a large, dominant vessel with mild ostial stenosis, mild disease in the mid and distal vessel. There is mild disease in the moderate caliber PDA.  5. Severe aortic  stenosis (mean gradient 42.1 mmHg, peak to peak gradient 49 mmHg, AVA 1.2 cm2).   Recommendations: I think his CAD can be managed at this time with medical therapy. He is having no angina. The LAD lesion is distal and the vessel is small in caliber (not favorable for stenting) and this only supplies the apical segment of myocardium. The Circumflex lesion is moderate. Will proceed with workup for AVR vs TAVR. He has an appointment with Dr. Cornelius Moras next week. I suspect that he will be a good TAVR candidate.       Assessment/Plan:  1. Prior TAVR - off plavix SBE prophylaxis TTE 09/29/18 EF 60-65% 29 mm TAVR valve mean gradient 18 peak 34 mmHg mild PVL f/u with me with echo in September   2. CAD - managed medically - he has no active symptoms.   3. HTN - Well controlled.  Continue current medications and low sodium Dash type diet.    4. CKD - stable - most recent lab noted. Cr 1.45  5. 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 He has not wanted to follow up with pulmonary.   6. Thrombocytopenia - PLT 72 12/2018 f/u primary consider hematology referral   Current medicines are reviewed with the patient today.  The patient does not have concerns regarding medicines other than what has been noted above.  The following changes have been made:  See above.  Labs/ tests ordered today include:   No orders of the defined types were placed in this encounter.    Disposition:   FU cardiology September with echo    Patient is agreeable to this plan and will call if any problems develop in the interim.   Signed: Charlton Haws, MD  03/09/2019 2:26 PM  [facility] [address] [place], [place]  [address] Phone: [phone] Fax: [phone]

## 2019-03-09 ENCOUNTER — Other Ambulatory Visit: Payer: Self-pay

## 2019-03-09 ENCOUNTER — Ambulatory Visit: Payer: Medicare PPO | Admitting: Cardiovascular Disease

## 2019-03-09 ENCOUNTER — Encounter: Payer: Self-pay | Admitting: Cardiovascular Disease

## 2019-03-09 VITALS — BP 118/72 | HR 60 | Ht 74.0 in | Wt 201.0 lb

## 2019-03-09 DIAGNOSIS — Z952 Presence of prosthetic heart valve: Secondary | ICD-10-CM | POA: Diagnosis not present

## 2019-03-09 NOTE — Patient Instructions (Addendum)
Medication Instructions:   *If you need a refill on your cardiac medications before your next appointment, please call your pharmacy*   Lab Work:  If you have labs (blood work) drawn today and your tests are completely normal, you will receive your results only by: Marland Kitchen MyChart Message (if you have MyChart) OR . A paper copy in the mail If you have any lab test that is abnormal or we need to change your treatment, we will call you to review the results.   Testing/Procedures: Your physician has requested that you have an echocardiogram in September on a Tuesday. Echocardiography is a painless test that uses sound waves to create images of your heart. It provides your doctor with information about the size and shape of your heart and how well your heart's chambers and valves are working. This procedure takes approximately one hour. There are no restrictions for this procedure.  Follow-Up: At [facility], you and your health needs are our priority.  As part of our continuing mission to provide you with exceptional heart care, we have created designated Provider Care Teams.  These Care Teams include your primary Cardiologist (physician) and Advanced Practice Providers (APPs -  Physician Assistants and Nurse Practitioners) who all work together to provide you with the care you need, when you need it.  We recommend signing up for the patient portal called "MyChart".  Sign up information is provided on this After Visit Summary.  MyChart is used to connect with patients for Virtual Visits (Telemedicine).  Patients are able to view lab/test results, encounter notes, upcoming appointments, etc.  Non-urgent messages can be sent to your provider as well.   To learn more about what you can do with MyChart, go to [email or web address].    Your next appointment:   6 month(s)  The format for your next appointment:   In Person  Provider:   You may see Dr. Eden Emms or one of the following Advanced  Practice Providers on your designated Care Team:    Norma Fredrickson, NP  Nada Boozer, NP  Georgie Chard, NP

## 2019-05-16 IMAGING — CT CT CTA ABD/PEL W/CM AND/OR W/O CM
2 of 12 series · 9 of 46 positions shown, 14 images · IV contrast (APPLIED)
Comparison: No priors.

CLINICAL DATA: 72-year-old male with history of severe aortic
stenosis. Preprocedural study prior to potential transcatheter
aortic valve replacement (TAVR) procedure.

EXAM:
CT ANGIOGRAPHY CHEST, ABDOMEN AND PELVIS
TECHNIQUE: Multidetector CT imaging through the chest, abdomen and pelvis was
performed using the standard protocol during bolus administration of
intravenous contrast. Multiplanar reconstructed images and MIPs were
obtained and reviewed to evaluate the vascular anatomy.
CONTRAST:  <See Chart> S4CECL-MSR IOPAMIDOL (S4CECL-MSR) INJECTION
76%

[Series 14: ax thins · axial · 0.59mm/px · z∈[+776,+1310]mm · 8 of 688 slices shown, 13 images]
[im 77/688  soft-tissue]
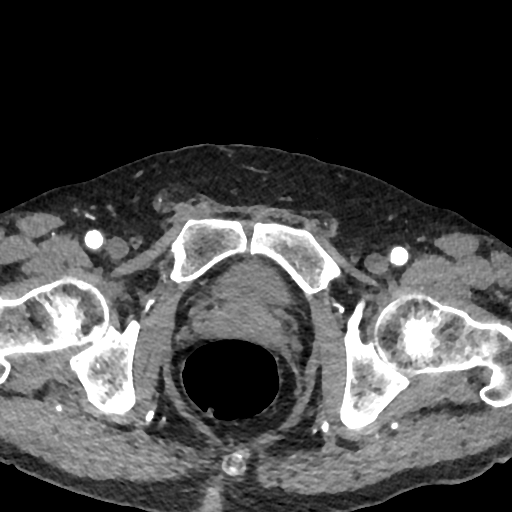
[im 77/688  bone]
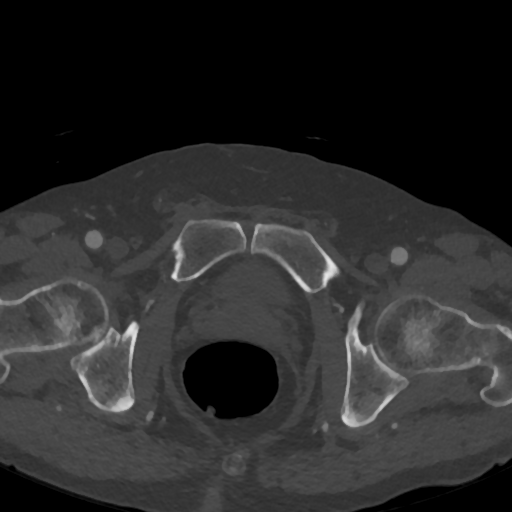
[im 153/688  soft-tissue]
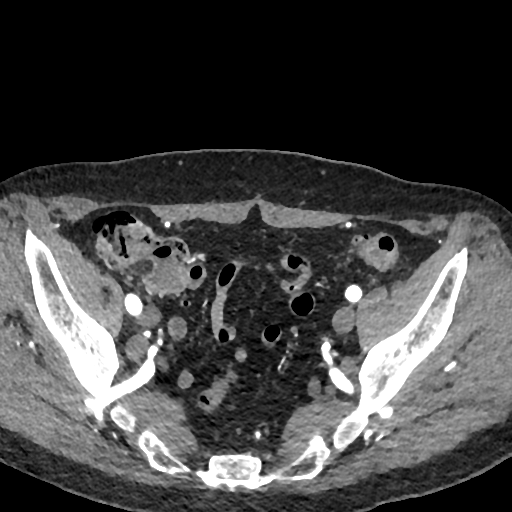
[im 230/688  soft-tissue]
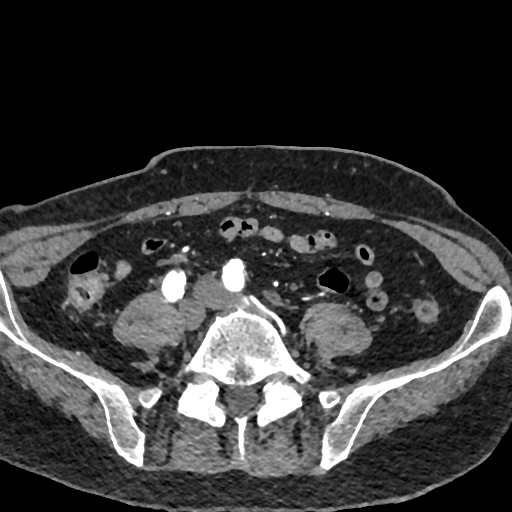
[im 306/688  soft-tissue]
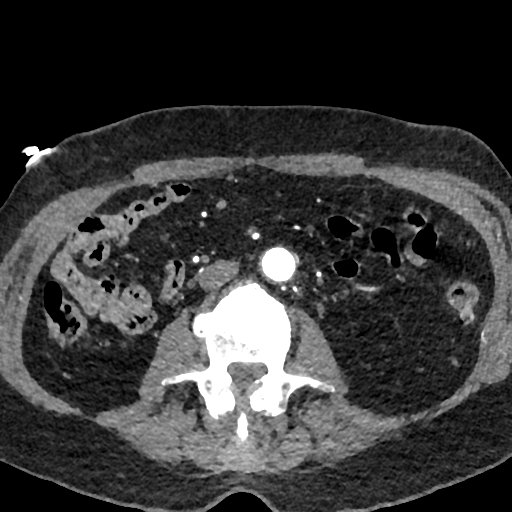
[im 382/688  soft-tissue]
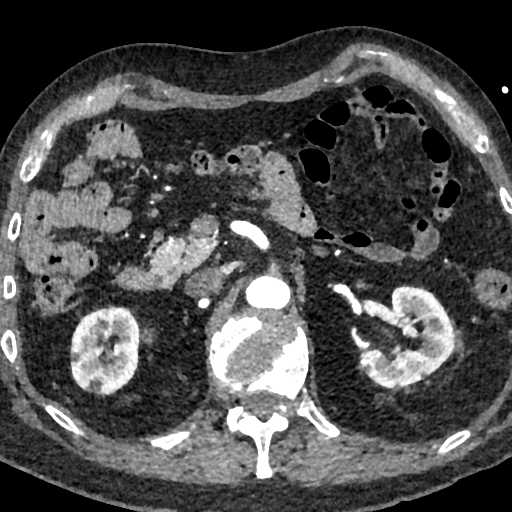
[im 382/688  lung]
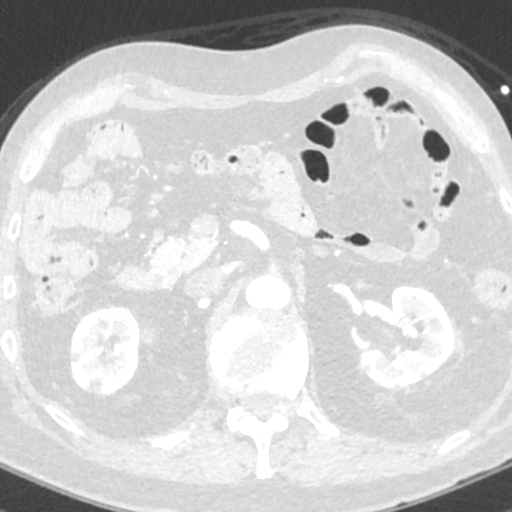
[im 459/688  soft-tissue]
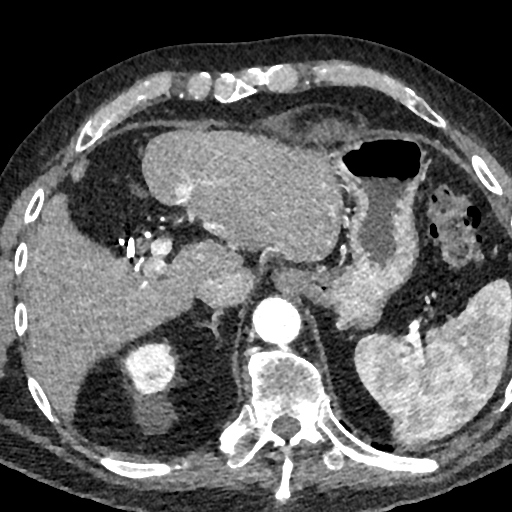
[im 459/688  lung]
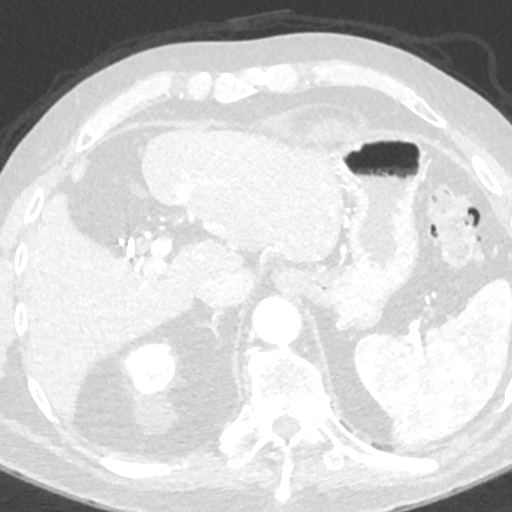
[im 535/688  soft-tissue]
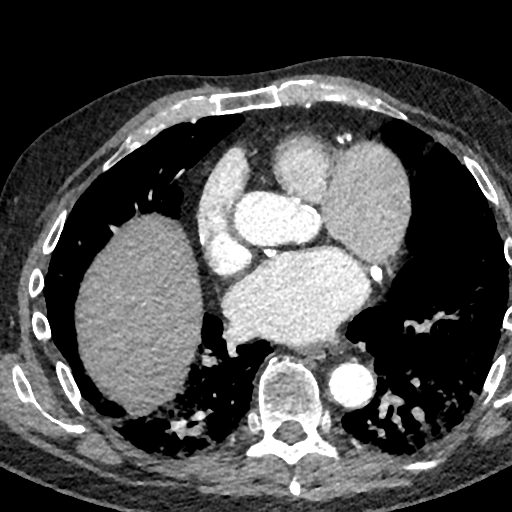
[im 535/688  lung]
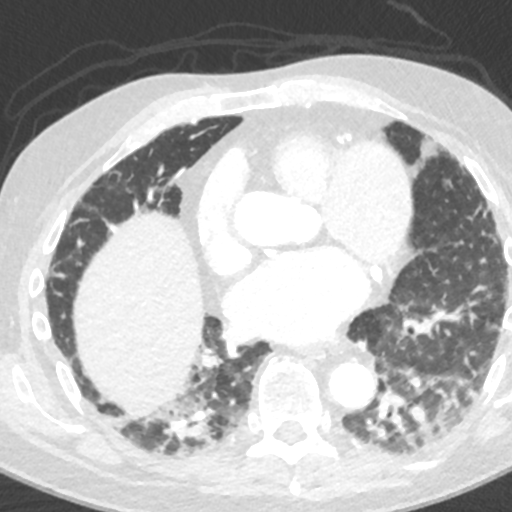
[im 611/688  soft-tissue]
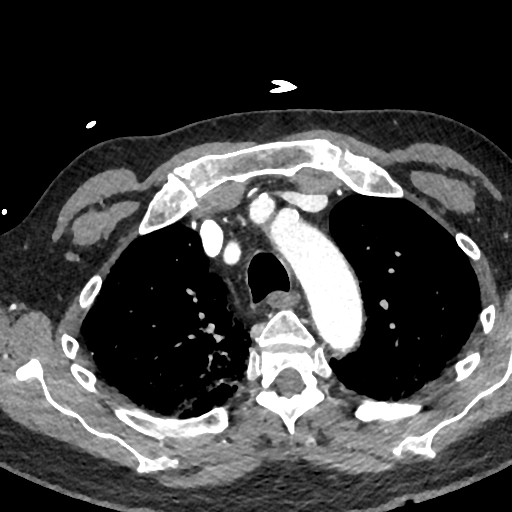
[im 611/688  lung]
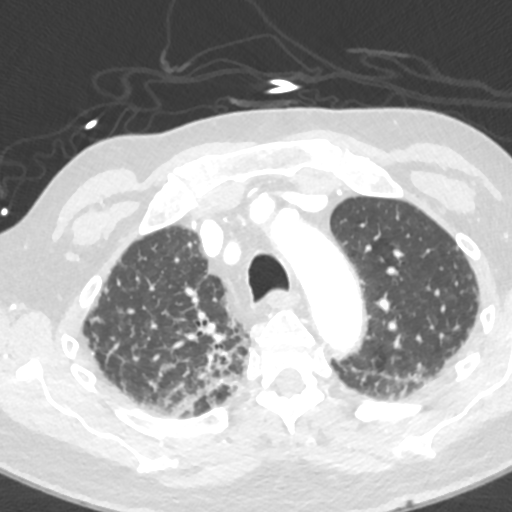

[Series 17: cor · coronal · 0.76mm/px · 1 of 150 slices shown]
[im 75/150  soft-tissue]
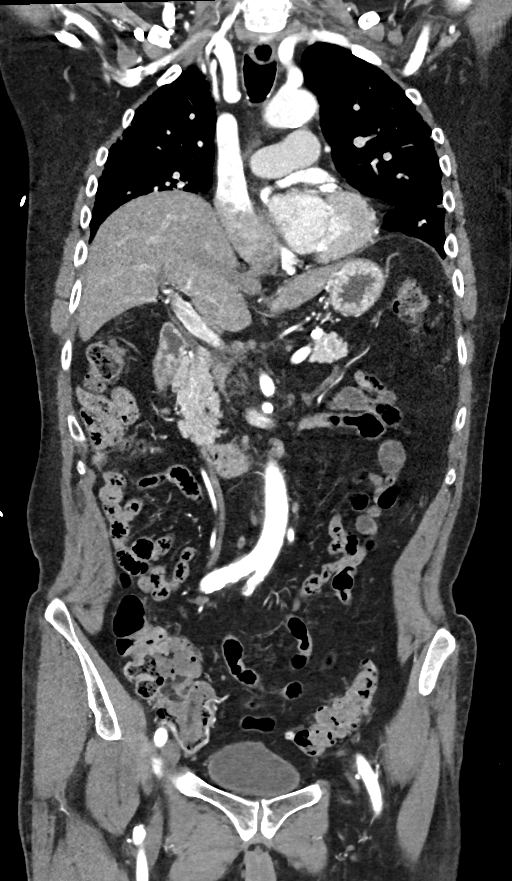

[9 of 46 positions shown; findings below may reference images not displayed]

FINDINGS: CTA CHEST FINDINGS

Cardiovascular: Heart size is mildly enlarged. Concentric left
ventricular hypertrophy. There is no significant pericardial fluid,
thickening or pericardial calcification. There is aortic
atherosclerosis, as well as atherosclerosis of the great vessels of
the mediastinum and the coronary arteries, including calcified
atherosclerotic plaque in the left main, left anterior descending,
left circumflex and right coronary arteries. Severe thickening and
calcification of the aortic valve. Moderate calcifications of the
mitral annulus.

Mediastinum/Lymph Nodes: No pathologically enlarged mediastinal or
hilar lymph nodes. Esophagus is unremarkable in appearance. No
axillary lymphadenopathy.

Lungs/Pleura: Unusual areas of thickening of the peribronchovascular
interstitium with some associated peribronchovascular ground-glass
attenuation and consolidative changes most evident throughout the
dependent portions of the lungs bilaterally. No frank honeycombing.
No definite bronchiectasis. No pleural effusions. No suspicious
appearing pulmonary nodules or masses.

Musculoskeletal/Soft Tissues: There are no aggressive appearing
lytic or blastic lesions noted in the visualized portions of the
skeleton.

CTA ABDOMEN AND PELVIS FINDINGS

Hepatobiliary: No cystic or solid hepatic lesions. No intra or
extrahepatic biliary ductal dilatation. Status post cholecystectomy.

Pancreas: No pancreatic mass. No pancreatic ductal dilatation. No
pancreatic or peripancreatic fluid or inflammatory changes.

Spleen: Unremarkable.

Adrenals/Urinary Tract: Multiple low-attenuation lesions in both
kidneys, compatible with simple cysts, measuring up to 3.9 cm in the
anterior aspect of the interpolar region. Several other
subcentimeter low-attenuation lesions in both kidneys are too small
to definitively characterize, but are statistically likely to
represent tiny cysts. Bilateral adrenal glands are normal in
appearance. No hydroureteronephrosis. Urinary bladder is normal in
appearance.

Stomach/Bowel: The appearance of the stomach is normal. There is no
pathologic dilatation of small bowel or colon. Numerous colonic
diverticulae are noted, without surrounding inflammatory changes to
suggest an acute diverticulitis at this time. Normal appendix.

Vascular/Lymphatic: Aortic atherosclerosis, with vascular findings
and measurements pertinent to potential TAVR procedure, as detailed
below. No aneurysm or dissection noted in the abdominal or pelvic
vasculature. The celiac axis, superior mesenteric artery and
inferior mesenteric artery are all widely patent without
hemodynamically significant stenosis. Single renal arteries are both
widely patent without hemodynamically significant stenosis. No
lymphadenopathy noted in the abdomen or pelvis.

Reproductive: Prostate gland and seminal vesicles are unremarkable
in appearance.

Other: No significant volume of ascites.  No pneumoperitoneum.

Musculoskeletal: There are no aggressive appearing lytic or blastic
lesions noted in the visualized portions of the skeleton.

VASCULAR MEASUREMENTS PERTINENT TO TAVR:

AORTA:

Minimal Aortic Jiameter-4U x 19 mm

Severity of Aortic Calcification-mild

RIGHT PELVIS:

Right Common Iliac Artery -

Minimal 0iameter-JX.Y x 12.5 mm

Tortuosity-moderate

Calcification-moderate

Right External Iliac Artery -

Minimal Kiameter-D.Z x 9.1 mm

Tortuosity-mild

Calcification-none

Right Common Femoral Artery -

Minimal Jiameter-MK.K x 10.2 mm

Tortuosity-mild

Calcification-mild

LEFT PELVIS:

Left Common Iliac Artery -

Minimal Aiameter-FH.H x 12.3 mm

Tortuosity-severe

Calcification-moderate

Left External Iliac Artery -

Minimal Kiameter-D.Z x 9.2 mm

Tortuosity-mild

Calcification-none

Left Common Femoral Artery -

Minimal 2iameter-D8.G x 9.5 mm

Tortuosity-mild

Calcification-mild

Review of the MIP images confirms the above findings.
IMPRESSION: 1. Vascular findings and measurements pertinent to potential TAVR
procedure, as detailed above.
2. Severe thickening calcification of the aortic valve, compatible
with the reported clinical history of severe aortic stenosis.
3. Cardiomegaly with concentric left ventricular hypertrophy.
4. Aortic atherosclerosis, in addition to left main and 3 vessel
coronary artery disease.
5. Unusual appearance of the lungs which is of uncertain etiology
and significance. Although a portion of these findings could be
attributable to a background of some interstitial pulmonary edema,
the possibility of interstitial lung disease should be considered.
Followup nonemergent outpatient high-resolution chest CT is
suggested after the patient's procedure and once the patient has
fully recovered to better evaluate for underlying interstitial lung
disease.
6. Colonic diverticulosis without evidence of acute diverticulitis
at this time.
7. Additional incidental findings, as above.

Aortic Atherosclerosis (C2VZX-DEL.L).

## 2019-05-24 IMAGING — CR DG CHEST 2V
2 series · 2 of 2 positions shown · non-contrast
Comparison: CT chest 05/20/2017

CLINICAL DATA: Preop TAVR

EXAM:
CHEST - 2 VIEW

[w chest pa]
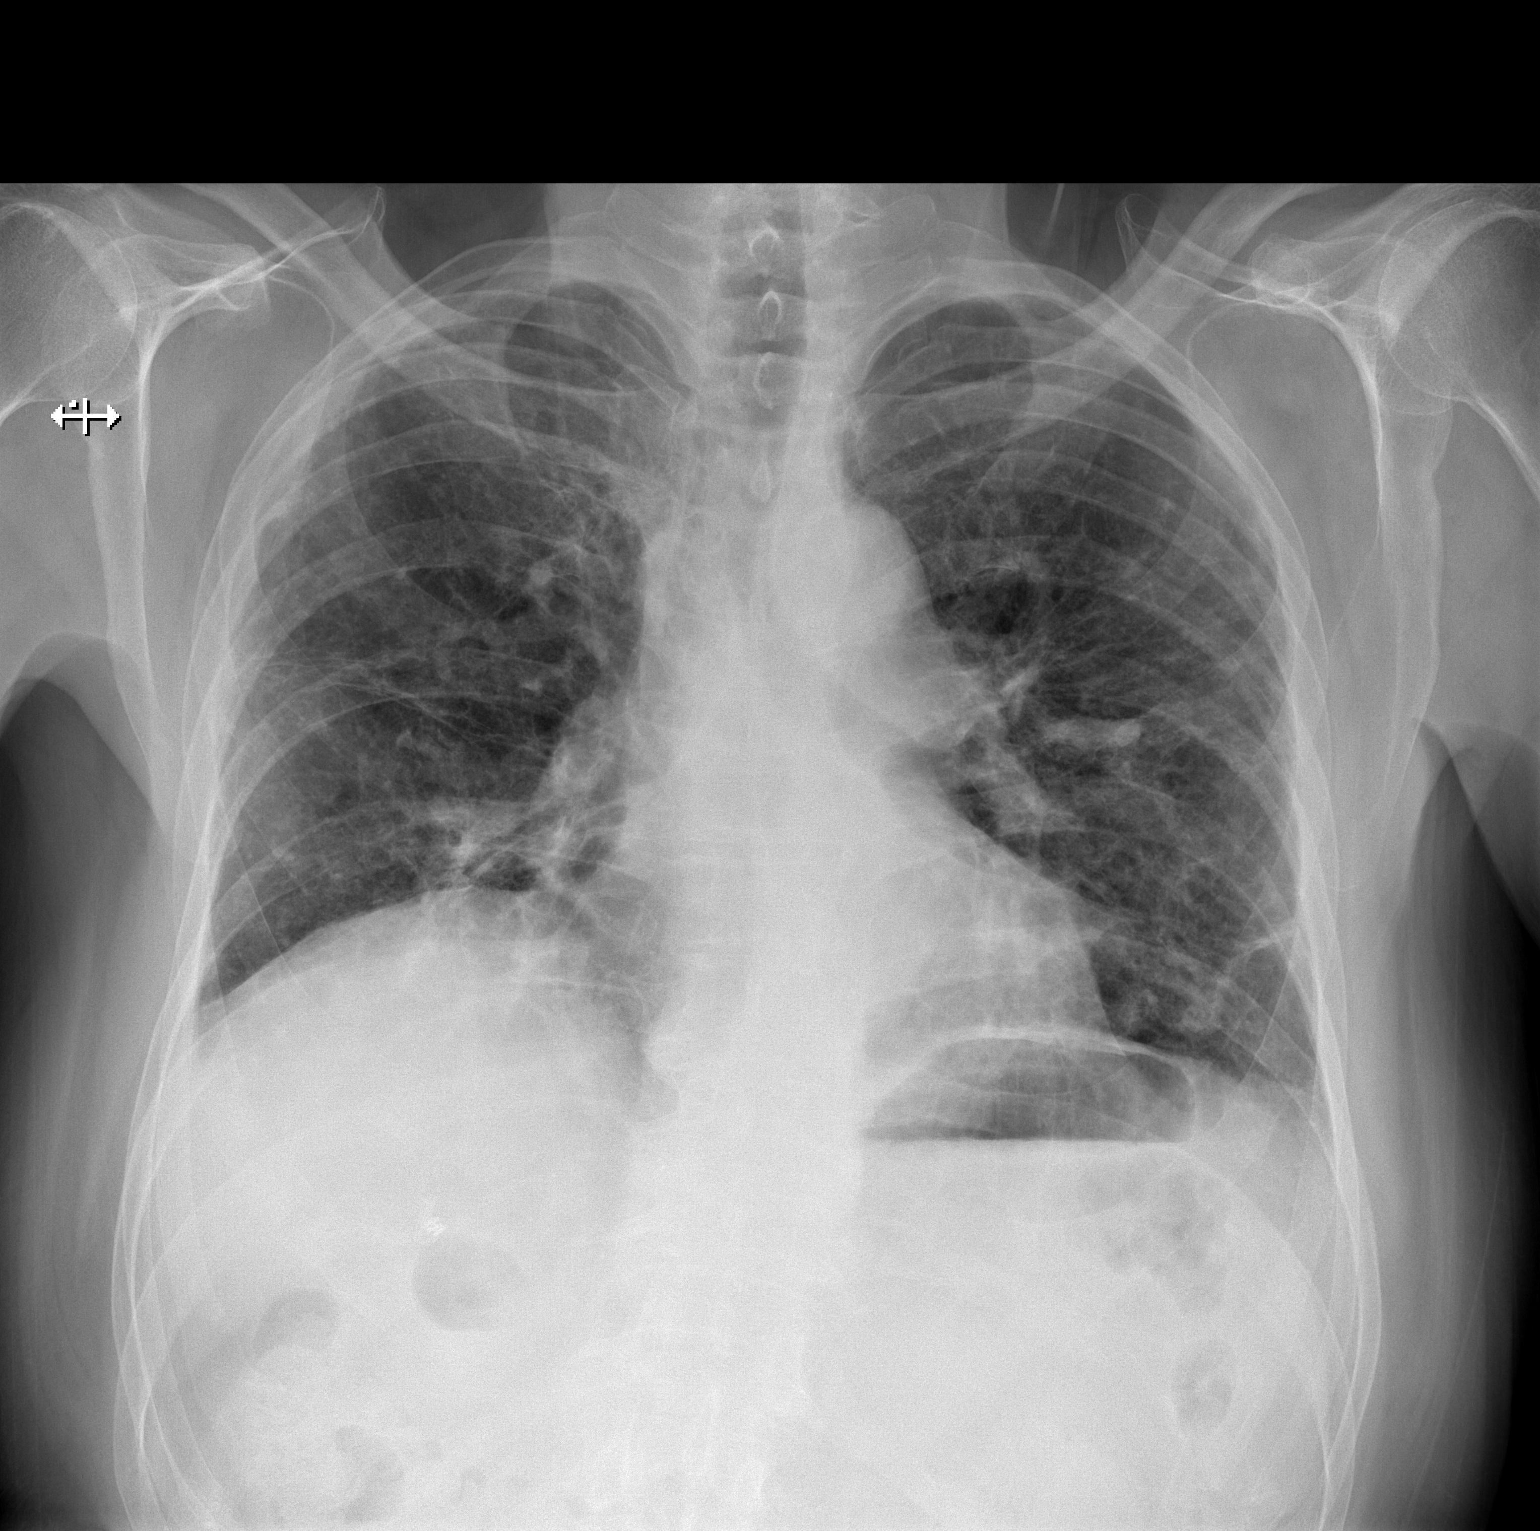

[w chest lat]
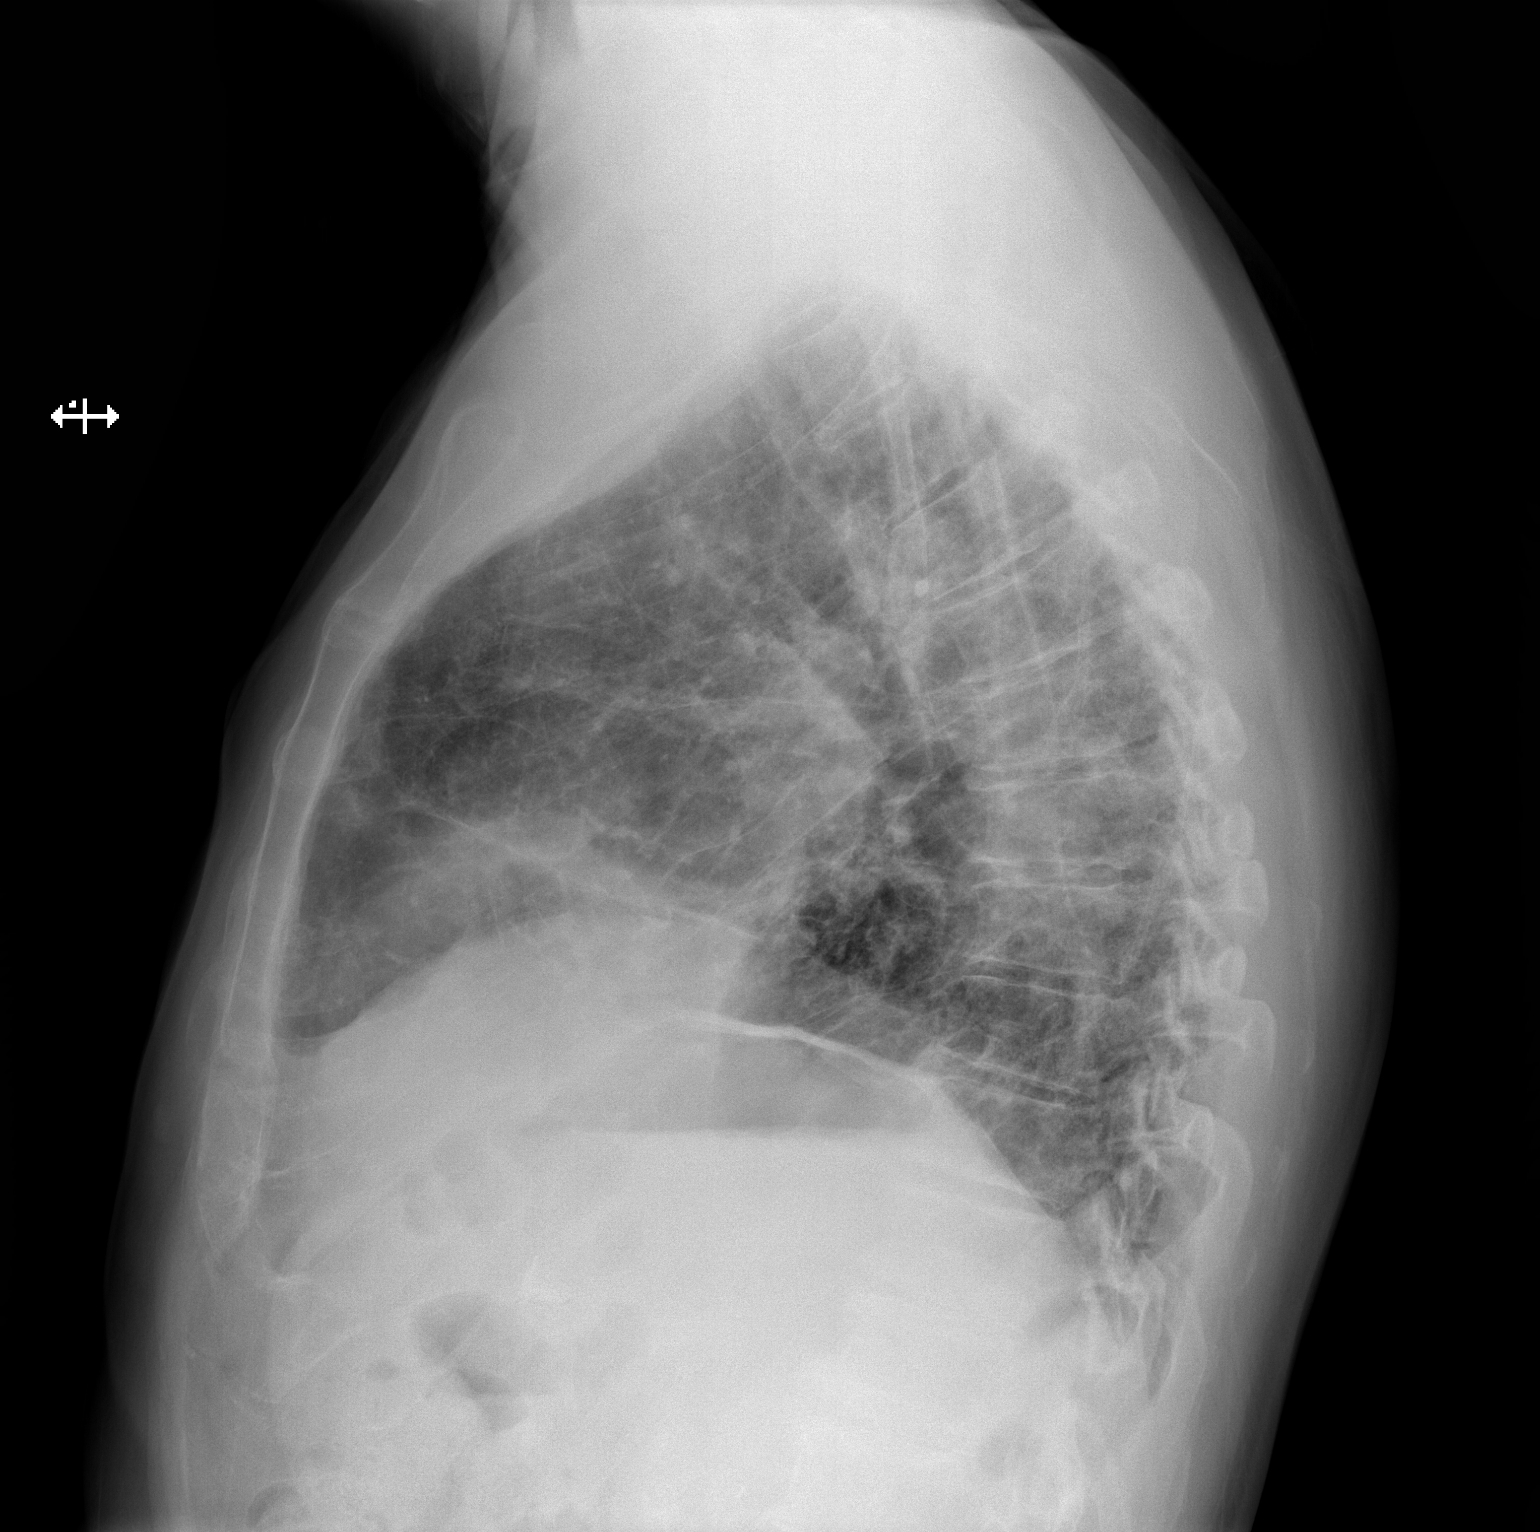

[2 of 2 positions shown; findings below may reference images not displayed]

FINDINGS: There is mild bilateral interstitial thickening. There is right
pleural thickening. There is no pleural effusion or pneumothorax.
The heart and mediastinal contours are unremarkable.

The osseous structures are unremarkable.
IMPRESSION: No active cardiopulmonary disease.

## 2019-05-30 IMAGING — DX DG CHEST 1V PORT
1 series · 2 of 2 positions shown · non-contrast
Comparison: 05/28/2017

CLINICAL DATA: Status post TAVR

EXAM:
PORTABLE CHEST 1 VIEW

[Series 1: chest · 0.14mm/px · 2 of 2 slices shown]
[im 1/2]
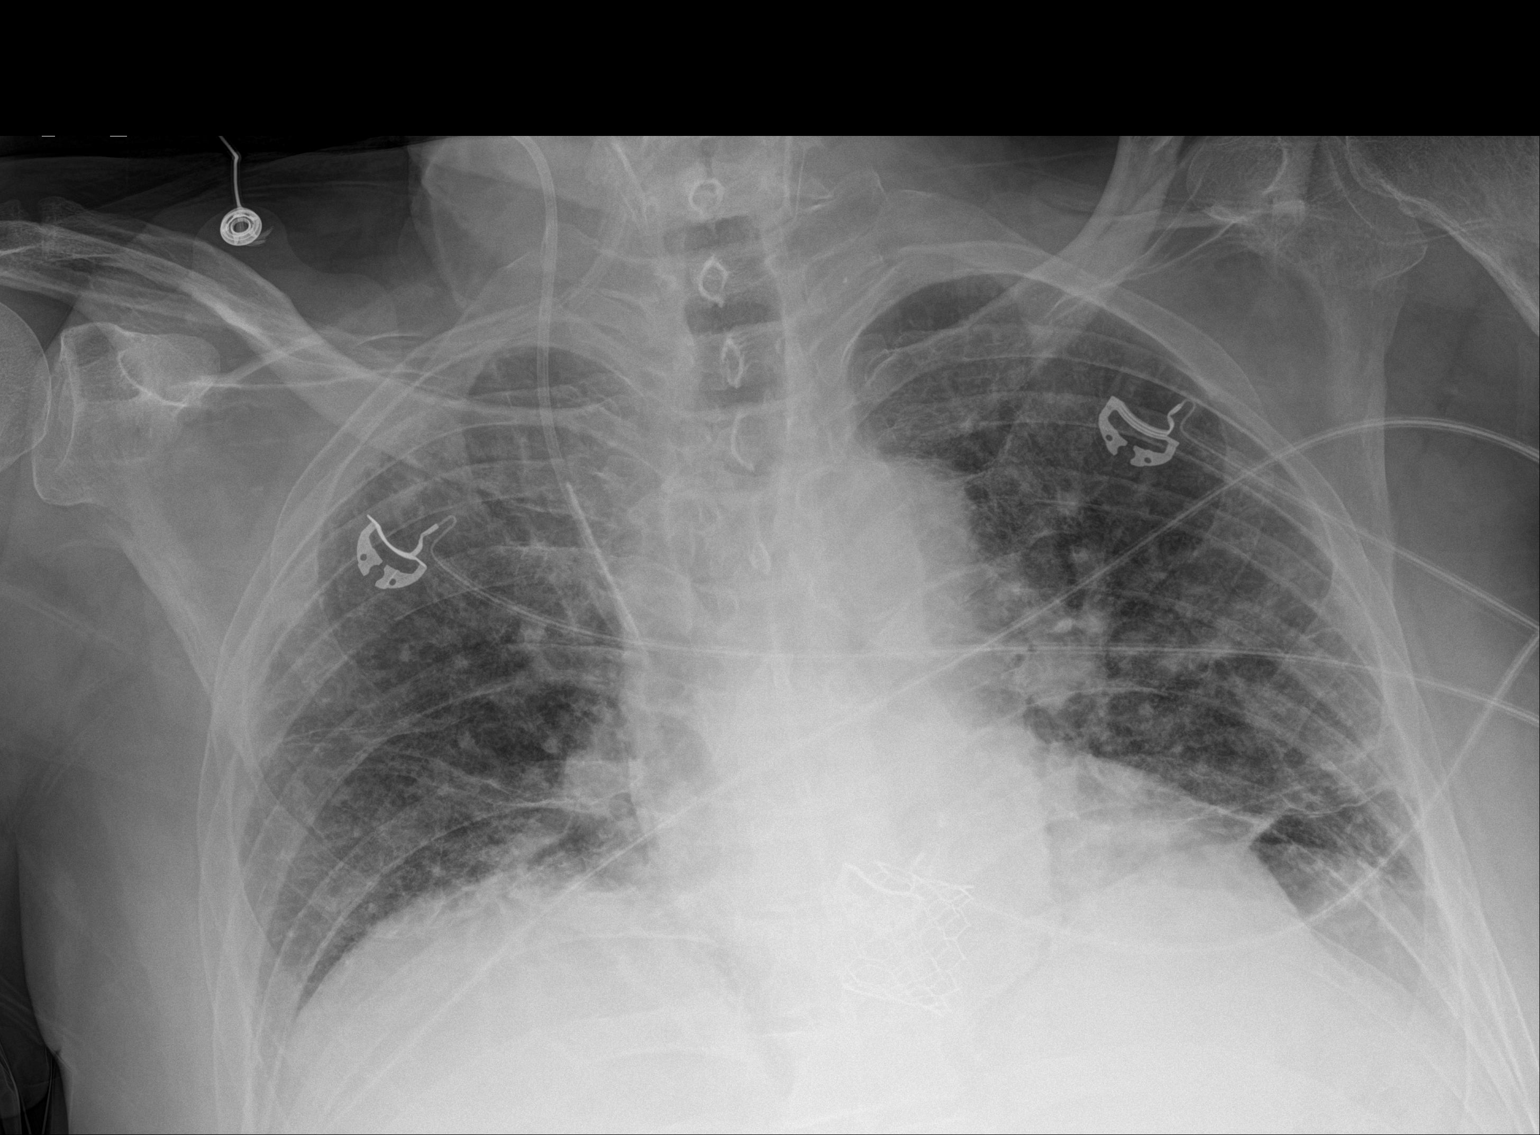
[im 2/2]
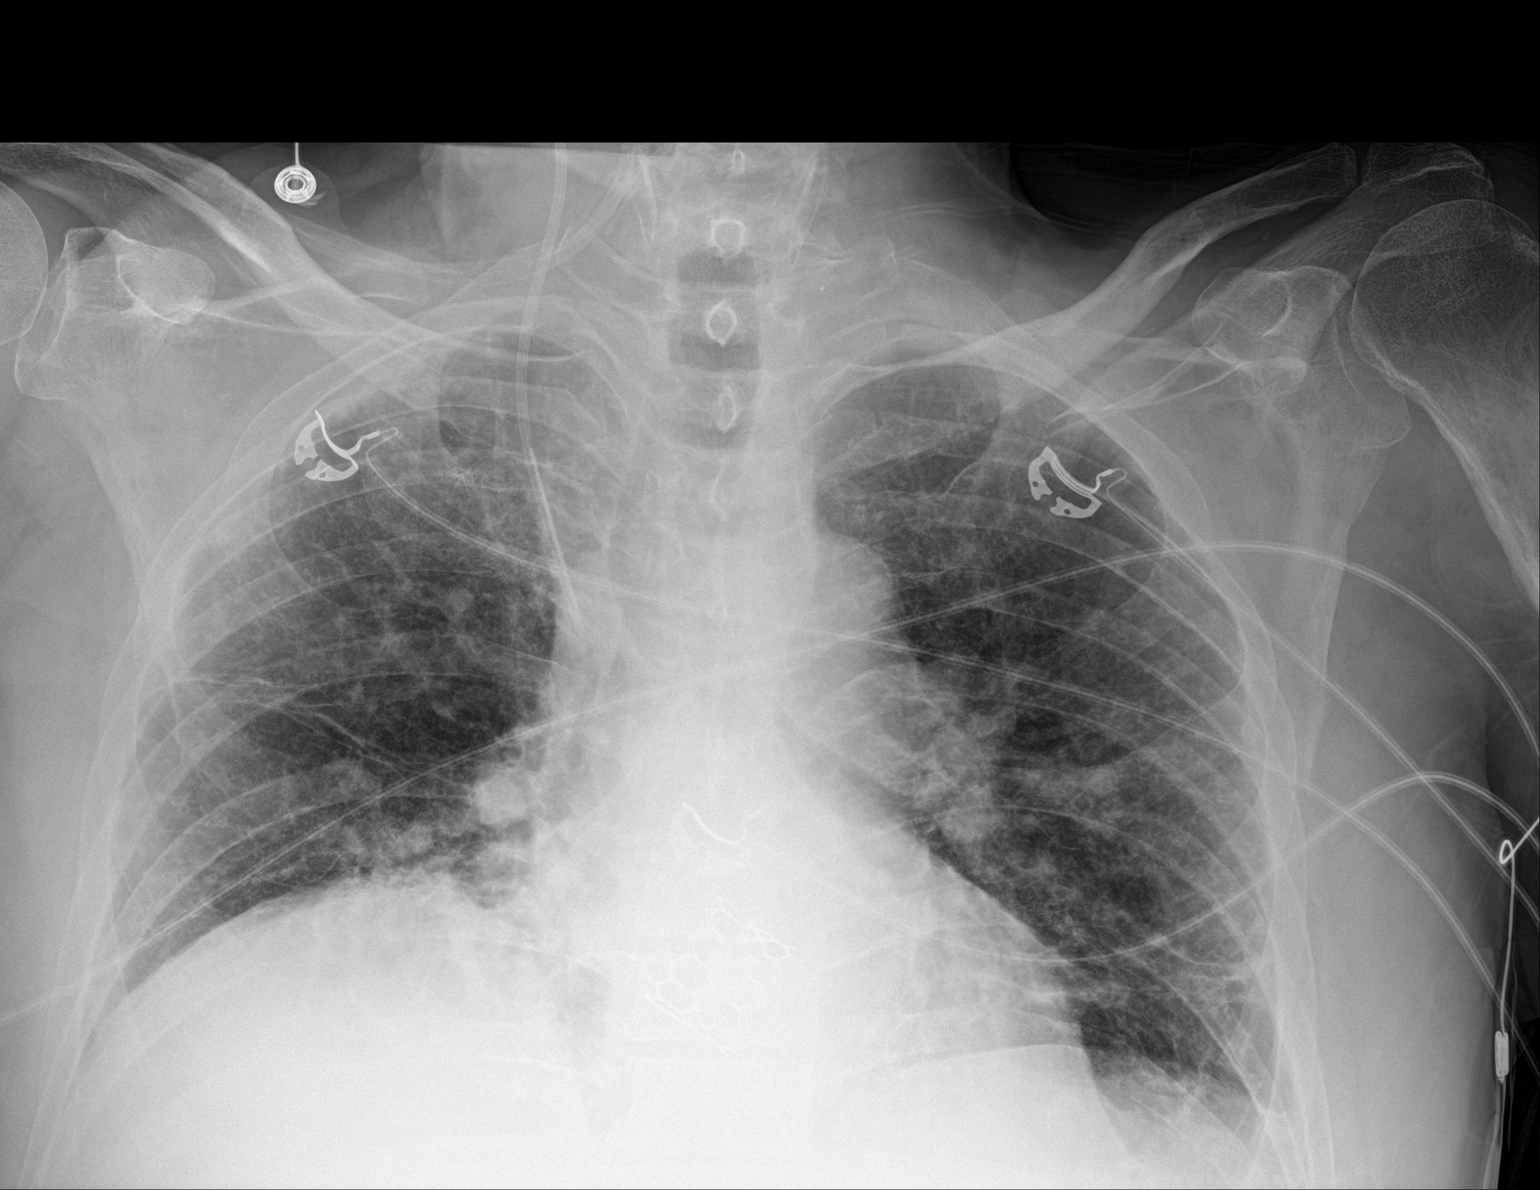

[2 of 2 positions shown; findings below may reference images not displayed]

FINDINGS: There is a right jugular central venous catheter with the tip
projecting over the SVC. There is bilateral mild interstitial
thickening likely reflecting interstitial edema. There is no focal
consolidation. There is no pleural effusion or pneumothorax. The
heart and mediastinal contours are unremarkable. There is evidence
of interval TAVR.

The osseous structures are unremarkable.
IMPRESSION: 1. Mild interstitial edema.
2. Interval TAVR.
3. Right jugular central venous catheter with the tip projecting
over the SVC.

## 2019-06-04 ENCOUNTER — Telehealth: Payer: Self-pay | Admitting: Cardiovascular Disease

## 2019-06-04 NOTE — Telephone Encounter (Signed)
New message   Pt c/o BP issue: STAT if pt c/o blurred vision, one-sided weakness or slurred speech  1. What are your last 5 BP readings? 170/84   2. Are you having any other symptoms (ex. Dizziness, headache, blurred vision, passed out)? Headache   3. What is your BP issue? Patient states that his b/p is elevated.

## 2019-06-04 NOTE — Telephone Encounter (Signed)
Called patient back with Dr. Nishan's recommendations. Patient verbalized understanding. 

## 2019-06-04 NOTE — Telephone Encounter (Signed)
Primary should be able to take care of bP

## 2019-06-04 NOTE — Telephone Encounter (Signed)
Called patient back about his message. Patient complaining of his BP being elevated. Patient stated his PCP started him on Losartan 50 mg daily and then increased it to 100 mg, but patient is taking 50 mg BID. Patient stated he SBP was 190's, now in the 170's. Patient stated his PCP was going to call him in an additional medication for his BP as well. Patient stated he would also need to change his appointment in July to June due to transportation. Moved patient's appointments to June. Will forward to Dr. Eden Emms to make him aware of patient's BP.

## 2019-06-30 NOTE — Progress Notes (Signed)
CARDIOLOGY OFFICE NOTE  Date:  07/01/2019    Jason Anthony Date of Birth: 06-11-1945 Medical Record #[id number]  PCP:  Bedelia Person, MD  Cardiologist:  Eden Emms    No chief complaint on file.   History of Present Illness:  74 y.o. f/u HTN, HLD, Sarcoidosis, CKD, Tremor and TAVR  Severe AS with mean gradient 42 mmHg normal EF. No significant CAD at cath 29 mm Sapien 3 Valve placed 06/03/17 with post procedure TTE normal EF and trivial PVL. Off plavix at this time With known thrombocytopenia   Continues to work about 30 some hours a week at [employer]. He does bruise rather easily. But less off plavix  Echo done 09/29/18 stable mild PVL  Mean gradient peak 34 mmHg  Doing well with no syncope, dyspnea, palpitations or chest pain  Lives alone Has 2 nephews in area   BP has been labile / High primary has adjusted dose of ARB and added low dose norvasc with improvement Reviewed TTE today 07/01/19 and still with mild AR In apical views it looks more central gradients have decreased With mean 11 and peak 20 mmHg   Still working hard at [employer] with no cardiac symptoms    Past Medical History:  Diagnosis Date   Chronic lower back pain    CKD (chronic kidney disease)    DDD (degenerative disc disease), lumbar    Essential tremor    Hyperlipidemia    Hypertension    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   29 mm Edwards Sapien 3 transcatheter heart valve placed via percutaneous right transfemoral approach    Sarcoidosis    Severe aortic stenosis    s/p TAVR    Thrombocytopenia (HCC)     Past Surgical History:  Procedure Laterality Date   CARDIAC CATHETERIZATION  04/30/2017   COLONOSCOPY     HAMMER TOE SURGERY     "?toe; ?side"   INGUINAL HERNIA REPAIR Right    INTRAOPERATIVE TRANSTHORACIC ECHOCARDIOGRAM N/A 06/03/2017   Procedure: INTRAOPERATIVE TRANSTHORACIC ECHOCARDIOGRAM;  Surgeon: Kathleene Hazel, MD;  Location: [facility]  OR;  Service: Open Heart Surgery;  Laterality: N/A;   LAPAROSCOPIC CHOLECYSTECTOMY     RIGHT/LEFT HEART CATH AND CORONARY ANGIOGRAPHY N/A 04/30/2017   Procedure: RIGHT/LEFT HEART CATH AND CORONARY ANGIOGRAPHY;  Surgeon: Kathleene Hazel, MD;  Location: [facility] INVASIVE CV LAB;  Service: Cardiovascular;  Laterality: N/A;   SALIVARY STONE REMOVAL  1961   "spit gland had a stone on it"   TRANSCATHETER AORTIC VALVE REPLACEMENT, TRANSFEMORAL N/A 06/03/2017   Procedure: TRANSCATHETER AORTIC VALVE REPLACEMENT, TRANSFEMORAL;  Surgeon: Kathleene Hazel, MD;  Location: [facility] OR;  Service: Open Heart Surgery;  Laterality: N/A;     Medications: Current Meds  Medication Sig   amoxicillin (AMOXIL) 500 MG tablet TAKE 4 TABLETS ONE HOUR BEFORE ANY DENTAL PROCEDURE.   aspirin EC 81 MG tablet Take 81 mg by mouth as directed.   clonazePAM (KLONOPIN) 0.5 MG tablet Take 0.5 mg by mouth 2 (two) times daily as needed for anxiety.   fluticasone (FLONASE) 50 MCG/ACT nasal spray    losartan (COZAAR) 50 MG tablet Take 50 mg by mouth in the morning and at bedtime.   sertraline (ZOLOFT) 50 MG tablet Take by mouth daily.   simvastatin (ZOCOR) 40 MG tablet Take 40 mg by mouth daily after supper.    traZODone (DESYREL) 50 MG tablet Take 100 mg by mouth at bedtime.      Allergies: No  Known Allergies  Social History: The patient  reports that he has never smoked. He has never used smokeless tobacco. He reports that he does not drink alcohol and does not use drugs.   Family History: The patient's family history includes Breast cancer in his sister; CVA (age of onset: 54) in his father; Hypertension in his mother.   Review of Systems: Please see the history of present illness.   Otherwise, the review of systems is positive for none.   All other systems are reviewed and negative.   Physical Exam: VS:  BP (!) 144/62    Pulse (!) 59    Ht 6\' 2"  (1.88 m)    Wt 196 lb (88.9 kg)    SpO2 97%    BMI 25.16  kg/m  .  BMI Body mass index is 25.16 kg/m.  Wt Readings from Last 3 Encounters:  07/01/19 196 lb (88.9 kg)  03/09/19 201 lb (91.2 kg)  01/28/18 187 lb 12.8 oz (85.2 kg)   Affect appropriate Healthy:  appears stated age HEENT: normal Neck supple with no adenopathy JVP normal no bruits no thyromegaly Lungs clear with no wheezing and good diaphragmatic motion Heart:  S1/S2 SEM thorough  TAVR valve no AR  murmur, no rub, gallop or click PMI normal Abdomen: benighn, BS positve, no tenderness, no AAA no bruit.  No HSM or HJR Distal pulses intact with no bruits No edema Neuro non-focal Skin warm and dry No muscular weakness   LABORATORY DATA:  EKG:   SR rate 66 LBBB   Lab Results  Component Value Date   WBC 4.9 06/05/2017   HGB 9.5 (L) 06/05/2017   HCT 28.9 (L) 06/05/2017   PLT 70 (L) 06/05/2017   GLUCOSE 88 06/05/2017   ALT 11 (L) 05/28/2017   AST 22 05/28/2017   NA 139 06/05/2017   K 4.4 06/05/2017   CL 105 06/05/2017   CREATININE 1.47 (H) 06/05/2017   BUN 11 06/05/2017   CO2 29 06/05/2017   INR 1.26 06/03/2017   HGBA1C 5.0 05/28/2017     BNP (last 3 results) No results for input(s): BNP in the last 8760 hours.  ProBNP (last 3 results) No results for input(s): PROBNP in the last 8760 hours.   Other Studies Reviewed Today:  2D Echo 05-Oct-2018  1. Left ventricular ejection fraction, by visual estimation, is 60 to  65%. The left ventricle has normal function. Normal left ventricular size.  There is moderately increased left ventricular hypertrophy.  2. Left ventricular diastolic Doppler parameters are consistent with  impaired relaxation pattern of LV diastolic filling.  3. Global right ventricle has normal systolic function.The right  ventricular size is normal. No increase in right ventricular wall  thickness.  4. Left atrial size was moderately dilated.  5. Right atrial size was normal.  6. Moderate mitral annular calcification.  7. The mitral  valve is normal in structure. Mild mitral valve  regurgitation. No evidence of mitral stenosis.  8. S/P TAVR with a 29 mm Edwards-SAPIEN 3 valve. Peak/mean transaortic  gradients are 34/18 mmHg. Mild paravalvular leak.  9. The tricuspid valve is normal in structure. Tricuspid valve  regurgitation is mild.  10. The pulmonic valve was normal in structure. Pulmonic valve  regurgitation is not visualized by color flow Doppler.  11. The inferior vena cava is normal in size with greater than 50%  respiratory variability, suggesting right atrial pressure of 3 mmHg.  12. When compared to the prior study from 07/04/2018  peak/mean transaortic  gradients are slightly increased, now 34/18 mmHg, previously mean 12 mmHg.  There is mild paravalvular leak.   _____________   TAVR OPERATIVE NOTE   Date of Procedure:06/03/2017  Procedure:   Transcatheter Aortic Valve Replacement - PercutaneousRightTransfemoral Approach Edwards Sapien 3 THV (size 30mm, model # 9600TFX, serial # [id number])  Co-Surgeons:Clarence H. Cornelius Moras, MD and Verne Carrow, MD  Pre-operative Echo Findings: ? Severe aortic stenosis ? Normalleft ventricular systolic function  Post-operative Echo Findings: ? Traceparavalvular leak ? Normalleft ventricular systolic function  _____________    RIGHT/LEFT HEART CATH AND CORONARY ANGIOGRAPHY 04/2017  Conclusion     Ost RCA to Prox RCA lesion is 30% stenosed.  Prox RCA to Mid RCA lesion is 20% stenosed.  Ost RPDA lesion is 30% stenosed.  Prox Cx lesion is 60% stenosed.  Dist LAD lesion is 80% stenosed.  Prox LAD lesion is 50% stenosed.  Prox LAD to Mid LAD lesion is 30% stenosed.  Ost 1st Diag lesion is 60% stenosed.  There is severe aortic valve stenosis.  Ost 2nd Mrg lesion is 30% stenosed.  Mid Cx lesion is 30% stenosed.   1. The Left main has no obstructive disease 2. The  LAD is a large caliber vessel in the proximal and mid vessel and then becomes smaller in caliber in the distal vessel. The mid LAD has a moderate stenosis that does not appear to be flow limiting. The distal LAD is smaller in caliber. There is a focal severe stenosis in the distal LAD. This vessel appears to be too small for stenting.  3. The Circumflex is a large caliber vessel with a moderate proximal stenosis just before the takeoff of a large obtuse marginal branch. The large caliber first obtuse marginal branch is free of obstructive disease.  4. The RCA is a large, dominant vessel with mild ostial stenosis, mild disease in the mid and distal vessel. There is mild disease in the moderate caliber PDA.  5. Severe aortic stenosis (mean gradient 42.1 mmHg, peak to peak gradient 49 mmHg, AVA 1.2 cm2).   Recommendations: I think his CAD can be managed at this time with medical therapy. He is having no angina. The LAD lesion is distal and the vessel is small in caliber (not favorable for stenting) and this only supplies the apical segment of myocardium. The Circumflex lesion is moderate. Will proceed with workup for AVR vs TAVR. He has an appointment with Dr. Cornelius Moras next week. I suspect that he will be a good TAVR candidate.       Assessment/Plan:  1. Prior TAVR - off plavix SBE prophylaxis TTE 09/29/18 EF 60-65% 29 mm TAVR valve mean gradient 18 peak 34 mmHg mild PVL Echo today 07/01/19 mild AR ? Central with lower gradients mean 11 peak 20 mmHg stable    2. CAD - managed medically - he has no active symptoms.   3. HTN - Improved with ARB and low dose norvasc f/u primary   4. CKD - stable - most recent lab noted. Cr 1.45  5. 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 He has not wanted to follow up with pulmonary.   6. Thrombocytopenia - PLT 72 12/2018 f/u primary consider hematology referral   Current medicines are reviewed with the patient today.  The  patient does not have concerns regarding medicines other than what has been noted above.  The following changes have been made:  See above.  Labs/ tests  ordered today include:   No orders of the defined types were placed in this encounter.    Disposition:   [facility] cardiology structural PA 6 months and me in a year    Patient is agreeable to this plan and will call if any problems develop in the interim.   Signed: Jenkins Rouge, MD  07/01/2019 2:30 PM  [facility] [address] [place], [place]  [address] Phone: [phone] Fax: [phone]

## 2019-07-01 ENCOUNTER — Encounter: Payer: Self-pay | Admitting: Cardiovascular Disease

## 2019-07-01 ENCOUNTER — Ambulatory Visit (HOSPITAL_COMMUNITY): Payer: Medicare PPO | Attending: Cardiology

## 2019-07-01 ENCOUNTER — Ambulatory Visit: Payer: Medicare PPO | Admitting: Cardiovascular Disease

## 2019-07-01 ENCOUNTER — Other Ambulatory Visit: Payer: Self-pay

## 2019-07-01 VITALS — BP 144/62 | HR 59 | Ht 74.0 in | Wt 196.0 lb

## 2019-07-01 DIAGNOSIS — Z952 Presence of prosthetic heart valve: Secondary | ICD-10-CM | POA: Diagnosis not present

## 2019-07-01 DIAGNOSIS — I359 Nonrheumatic aortic valve disorder, unspecified: Secondary | ICD-10-CM | POA: Diagnosis not present

## 2019-07-01 DIAGNOSIS — I10 Essential (primary) hypertension: Secondary | ICD-10-CM | POA: Diagnosis not present

## 2019-07-01 NOTE — Patient Instructions (Addendum)
Medication Instructions:  *If you need a refill on your cardiac medications before your next appointment, please call your pharmacy*  Lab Work: If you have labs (blood work) drawn today and your tests are completely normal, you will receive your results only by: Marland Kitchen MyChart Message (if you have MyChart) OR . A paper copy in the mail If you have any lab test that is abnormal or we need to change your treatment, we will call you to review the results.  Testing/Procedures: None ordered today.  Follow-Up: Your physician recommends that you schedule a follow-up appointment in: 6 months with Carlean Jews PA.  At [facility], you and your health needs are our priority.  As part of our continuing mission to provide you with exceptional heart care, we have created designated Provider Care Teams.  These Care Teams include your primary Cardiologist (physician) and Advanced Practice Providers (APPs -  Physician Assistants and Nurse Practitioners) who all work together to provide you with the care you need, when you need it.  We recommend signing up for the patient portal called "MyChart".  Sign up information is provided on this After Visit Summary.  MyChart is used to connect with patients for Virtual Visits (Telemedicine).  Patients are able to view lab/test results, encounter notes, upcoming appointments, etc.  Non-urgent messages can be sent to your provider as well.   To learn more about what you can do with MyChart, go to [email or web address].    Your next appointment:   12 month(s)  The format for your next appointment:   In Person  Provider:   You may see Dr. Eden Emms or one of the following Advanced Practice Providers on your designated Care Team:    Norma Fredrickson, NP  Nada Boozer, NP  Georgie Chard, NP

## 2019-07-13 ENCOUNTER — Ambulatory Visit: Payer: Medicare PPO | Admitting: Cardiovascular Disease

## 2019-07-13 ENCOUNTER — Other Ambulatory Visit (HOSPITAL_COMMUNITY): Payer: Medicare PPO

## 2019-08-03 ENCOUNTER — Other Ambulatory Visit (HOSPITAL_COMMUNITY): Payer: Medicare PPO

## 2019-08-18 ENCOUNTER — Ambulatory Visit: Payer: Medicare PPO | Admitting: Cardiovascular Disease

## 2020-01-10 ENCOUNTER — Encounter: Payer: Self-pay | Admitting: Physician Assistant

## 2020-01-10 NOTE — Progress Notes (Unsigned)
Cardiology Office Note    Date:  01/13/2020   ID:  Jason Anthony, DOB July 05, 1945, MRN [id number]  PCP:  Joseph Art, MD  Cardiologist:  Jenkins Rouge, MD  Electrophysiologist:  None   Chief Complaint: f/u prior TAVR, CAD  History of Present Illness:   Jason Anthony is a 75 y.o. male with history of severe AS s/p TAVR 05/2017, primarily nonobstructive CAD by cath 04/2017 per cath note below, HTN, HLD (managed by primary care), sarcoidoidosis, thrombocytopenia, tremor, CKD stage III, chronic back pain, DDD who presents back for routine follow-up, last OV 06/2019 and doing well at that time. Last 2D echo 06/2019 showed EF 65-70%, grade 2 DD, severe LAE, s/p TAVR with mild perivalvular leak, mild dilation of aortic root and ascending aorta - when compared to study of 09/2018, the peak and mean transaortic gradients had decreased from 34/78mmHg to 20/69mmHg and visually perivalvular AI appeared the same. Regarding his sarcoidosis, he has a long history of this with scarring of the lung by prior imaging and has not wanted to follow up with pulmonology. He saw them very remotely per his report in [place]. He also has a history of thrombocytopenia hence why he is no longer on  Plavix.  He presents for follow-up today doing well from cardiac standpoint without any new SOB, chest pain, dizziness, syncope or palpitations. No bleeding reported. He has been following closely with his primary care the last few weeks due to elevated BP and was started on losartan and amlodipine. He reports last reading there was around 118/70. He does not routinely check this at home. He states it is slightly elevated today because he forgot to take one of the losartan tablets. He reports he was told his PCP is somewhat concerned about his liver. He had bloodwork done there just a few days ago and we will have results faxed to [place]. He remains on simvastatin 40mg  at this time. He has had issues with chronic back pain. No falls. He works at  [employer].  Labwork independently reviewed: 01/2018 scanned - 01/2018 Hgb 11.6, Plt 72, K 5.2, Cr 1.45  Past Medical History:  Diagnosis Date  . CAD (coronary artery disease)    multivessel, primarily nonobstructive managed medically 2019  . Chronic lower back pain   . CKD (chronic kidney disease), stage III ([place])   . DDD (degenerative disc disease), lumbar   . Essential tremor   . Hyperlipidemia   . Hypertension   . 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 Sarcoidosis   . Severe aortic stenosis    s/p TAVR 05/2017  . Thrombocytopenia ([place])     Past Surgical History:  Procedure Laterality Date  . CARDIAC CATHETERIZATION  04/30/2017  . COLONOSCOPY    . HAMMER TOE SURGERY     "?toe; ?side"  . INGUINAL HERNIA REPAIR Right   . INTRAOPERATIVE TRANSTHORACIC ECHOCARDIOGRAM N/A 06/03/2017   Procedure: INTRAOPERATIVE TRANSTHORACIC ECHOCARDIOGRAM;  Surgeon: Burnell Blanks, MD;  Location: [facility];  Service: Open Heart Surgery;  Laterality: N/A;  . LAPAROSCOPIC CHOLECYSTECTOMY    . RIGHT/LEFT HEART CATH AND CORONARY ANGIOGRAPHY N/A 04/30/2017   Procedure: RIGHT/LEFT HEART CATH AND CORONARY ANGIOGRAPHY;  Surgeon: Burnell Blanks, MD;  Location: [facility];  Service: Cardiovascular;  Laterality: N/A;  . [place]   "spit gland had a stone on it"  .  TRANSCATHETER AORTIC VALVE REPLACEMENT, TRANSFEMORAL N/A 06/03/2017   Procedure: TRANSCATHETER AORTIC VALVE REPLACEMENT, TRANSFEMORAL;  Surgeon: Kathleene Hazel, MD;  Location: [facility];  Service: Open Heart Surgery;  Laterality: N/A;    Current Medications: Current Meds  Medication Sig  . amLODipine (NORVASC) 5 MG tablet Take 5 mg by mouth daily.  Marland Kitchen aspirin EC 81 MG tablet Take 81 mg by mouth daily.  . clonazePAM (KLONOPIN) 0.5 MG tablet Take 0.5 mg by mouth 2 (two) times daily  as needed for anxiety.  . fluticasone (FLONASE) 50 MCG/ACT nasal spray   . losartan (COZAAR) 50 MG tablet Take 50 mg by mouth in the morning and at bedtime.  . sertraline (ZOLOFT) 50 MG tablet Take by mouth daily.  . simvastatin (ZOCOR) 40 MG tablet Take 40 mg by mouth daily after supper.   . traZODone (DESYREL) 50 MG tablet Take 100 mg by mouth at bedtime.   Marland Kitchen amoxicillin (AMOXIL) 500 MG tablet TAKE 4 TABLETS ONE HOUR BEFORE ANY DENTAL PROCEDURE.      Allergies:   Patient has no known allergies.   Social History   Socioeconomic History  . Marital status: Divorced    Spouse name: Not on file  . Number of children: Not on file  . Years of education: Not on file  . Highest education level: Not on file  Occupational History  . Occupation: [id number]  Tobacco Use  . Smoking status: Never Smoker  . Smokeless tobacco: Never Used  Vaping Use  . Vaping Use: Never used  Substance and Sexual Activity  . Alcohol use: No  . Drug use: Never  . Sexual activity: Not Currently  Other Topics Concern  . Not on file  Social History Narrative  . Not on file   Social Determinants of Health   Financial Resource Strain: Not on file  Food Insecurity: Not on file  Transportation Needs: Not on file  Physical Activity: Not on file  Stress: Not on file  Social Connections: Not on file     Family History:  The patient's family history includes Breast cancer in his sister; CVA (age of onset: 55) in his father; Hypertension in his mother.  ROS:   Please see the history of present illness All other systems are reviewed and otherwise negative.     EKGs/Labs/Other Studies Reviewed:    Studies reviewed are outlined and summarized above. Reports included below if pertinent.  2D echo 07/01/19   1. Left ventricular ejection fraction, by estimation, is 65 to 70%. The  left ventricle has normal function. The left ventricle has no regional  wall motion abnormalities. Left ventricular diastolic  parameters are  consistent with Grade II diastolic  dysfunction (pseudonormalization). Elevated left ventricular end-diastolic  pressure.  2. Right ventricular systolic function is normal. The right ventricular  size is normal. There is normal pulmonary artery systolic pressure.  3. Left atrial size was severely dilated.  4. The mitral valve is normal in structure. Trivial mitral valve  regurgitation. No evidence of mitral stenosis.  5. There is a 29 mm Edwards Sapien prosthetic, stented (TAVR) valve  present in the aortic posiion. Echo findings are consistent with normal  structure and function of the aortic valve prosthesis. Mild perivalvular  leak seen in PSSA view at 1 o'clock.  Visually this appears mild but by PHT is moderate at . No aortic  stenosis is present. Aortic valve mean gradient measures 11.0 mmHg. Aortic  valve peak gradient measures 20.2 mmHg. Aortic valve  area, by VTI measures  3.22 cm. DI 0.61.  6. Aortic dilatation noted. There is mild dilatation of the aortic root  and of the ascending aorta measuring 37 mm and 16mm repsectively.  7. The inferior vena cava is normal in size with greater than 50%  respiratory variability, suggesting right atrial pressure of 3 mmHg.  8. WHen compared to study of 09/2018, the peak and mean transaortic  gradients have decreased from 34/27mmHg to 20/28mmHg and visually  perivalvular AI appears the same.   [facility] 04/2017  Ost RCA to Prox RCA lesion is 30% stenosed.  Prox RCA to Mid RCA lesion is 20% stenosed.  Ost RPDA lesion is 30% stenosed.  Prox Cx lesion is 60% stenosed.  Dist LAD lesion is 80% stenosed.  Prox LAD lesion is 50% stenosed.  Prox LAD to Mid LAD lesion is 30% stenosed.  Ost 1st Diag lesion is 60% stenosed.  There is severe aortic valve stenosis.  Ost 2nd Mrg lesion is 30% stenosed.  Mid Cx lesion is 30% stenosed.   1. The Left main has no obstructive disease 2. The LAD is a large caliber  vessel in the proximal and mid vessel and then becomes smaller in caliber in the distal vessel. The mid LAD has a moderate stenosis that does not appear to be flow limiting. The distal LAD is smaller in caliber. There is a focal severe stenosis in the distal LAD. This vessel appears to be too small for stenting.  3. The Circumflex is a large caliber vessel with a moderate proximal stenosis just before the takeoff of a large obtuse marginal branch. The large caliber first obtuse marginal branch is free of obstructive disease.  4. The RCA is a large, dominant vessel with mild ostial stenosis, mild disease in the mid and distal vessel. There is mild disease in the moderate caliber PDA.  5. Severe aortic stenosis (mean gradient 42.1 mmHg, peak to peak gradient 49 mmHg, AVA 1.2 cm2).   Recommendations: I think his CAD can be managed at this time with medical therapy. He is having no angina. The LAD lesion is distal and the vessel is small in caliber (not favorable for stenting) and this only supplies the apical segment of myocardium. The Circumflex lesion is moderate. Will proceed with workup for AVR vs TAVR. He has an appointment with Dr. Cornelius Moras next week. I suspect that he will be a good TAVR candidate.       EKG:  EKG is ordered today, personally reviewed, demonstrating NSR 66bpm, first degree AVB, poor R wave progression, NSIVCD, TWI avL  Recent Labs: No results found for requested labs within last 8760 hours.  Recent Lipid Panel No results found for: CHOL, TRIG, HDL, CHOLHDL, VLDL, LDLCALC, LDLDIRECT  PHYSICAL EXAM:    VS:  BP 138/72   Pulse 66   Ht 6\' 2"  (1.88 m)   Wt 209 lb 12.8 oz (95.2 kg)   SpO2 95%   BMI 26.94 kg/m   BMI: Body mass index is 26.94 kg/m.  GEN: Well nourished, well developed WM, in no acute distress HEENT: normocephalic, atraumatic Neck: no JVD, carotid bruits, or masses Cardiac: RRR; soft SEM RUSB, no rubs or gallops, no edema  Respiratory:  clear to auscultation  bilaterally, normal work of breathing GI: soft, nontender, nondistended, + BS MS: no deformity or atrophy Skin: warm and dry, no rash Neuro:  Alert and Oriented x 3, Strength and sensation are intact, follows commands Psych: euthymic mood, full affect  Wt Readings from Last 3 Encounters:  01/13/20 209 lb 12.8 oz (95.2 kg)  07/01/19 196 lb (88.9 kg)  03/09/19 201 lb (91.2 kg)     ASSESSMENT & PLAN:   1. Severe AS s/p TAVR - clinically stable. Continue baby aspirin as tolerated. Platelet count followed by primary care. No bleeding reported. Continue SBE prophylaxis prior to dental visits. Will send in updated PRN amoxicillin rx per patient request.  2. CAD - asymptomatic. Continue ASA as tolerated/as platelet count allows. See below regarding statin therapy.  3. Essential HTN - BP is marginally elevated today but he reports having forgotten to take one of his BP medications this morning. He reports it was normal when he went for his recent by primary care a few weeks ago. I rechecked his BP myself and got 138/60. We discussed obtaining some home readings to trend. He plans to obtain a home BP cuff and also has access to the BP cuff at work at [employer]. Instructions provided for checking his BP and recording 4 days of readings. He would like primary care to continue to manage this for him since they have been adjusting medicines this fall, and so he plans to contact their office with these readings. He also sees them back in March. Labs pending as above. Note renal insufficiency and hyperkalemia present on 2020 labs, so recommend attention to this on review.  4. Hyperlipidemia - amlodipine recently added - remains on simvastatin 40mg . The maximum recommended dose of simvastatin while on amlodipine is 20mg  daily. Patient also reports there's been some concern over liver function. We'll see if we can get a copy of the lab results his primary care recently performed. I've asked him to hold simvastatin  in the meantime - would consider resuming at lower dose (20mg  daily) or changing altogether to a different agent such as atorvastatin or rosuvastatin.  5. Thrombocytopenia - primary care is following this. As previously suggested by Dr. , would recommend primary care consider hematology referral if this is proven to be persistent on their follow-up labs.  Disposition: F/u with Dr. in 6 months.  Medication Adjustments/Labs and Tests Ordered: Current medicines are reviewed at length with the patient today.  Concerns regarding medicines are outlined above. Medication changes, Labs and Tests ordered today are summarized above and listed in the Patient Instructions accessible in Encounters.   Signed, , PA-C  01/13/2020 2:06 PM    [facility] [address] [place], [place], [address]  [place] Phone: [phone]; Fax: [phone]

## 2020-01-13 ENCOUNTER — Encounter: Payer: Self-pay | Admitting: Physician Assistant

## 2020-01-13 ENCOUNTER — Other Ambulatory Visit: Payer: Self-pay

## 2020-01-13 ENCOUNTER — Ambulatory Visit: Payer: Medicare PPO | Admitting: Physician Assistant

## 2020-01-13 ENCOUNTER — Telehealth: Payer: Self-pay | Admitting: Physician Assistant

## 2020-01-13 VITALS — BP 138/72 | HR 66 | Ht 74.0 in | Wt 209.8 lb

## 2020-01-13 DIAGNOSIS — E785 Hyperlipidemia, unspecified: Secondary | ICD-10-CM

## 2020-01-13 DIAGNOSIS — I2510 Atherosclerotic heart disease of native coronary artery without angina pectoris: Secondary | ICD-10-CM | POA: Diagnosis not present

## 2020-01-13 DIAGNOSIS — I10 Essential (primary) hypertension: Secondary | ICD-10-CM

## 2020-01-13 DIAGNOSIS — Z952 Presence of prosthetic heart valve: Secondary | ICD-10-CM | POA: Diagnosis not present

## 2020-01-13 DIAGNOSIS — I350 Nonrheumatic aortic (valve) stenosis: Secondary | ICD-10-CM

## 2020-01-13 DIAGNOSIS — D696 Thrombocytopenia, unspecified: Secondary | ICD-10-CM

## 2020-01-13 MED ORDER — AMOXICILLIN 500 MG PO TABS: ORAL_TABLET | ORAL | 2 refills | Status: AC

## 2020-01-13 NOTE — Telephone Encounter (Signed)
Returned call to pt.  He wasn't sure who called, but I have spoken to pt 2-3 times this afternoon, so not sure if it was one of these calls and he just seen the # on his phone.

## 2020-01-13 NOTE — Telephone Encounter (Signed)
Follow Up:     Pt said he had an appt today with Ronie Spies. Said he just received a call from here, but does not know who called.

## 2020-01-13 NOTE — Patient Instructions (Addendum)
Medication Instructions:  Your physician has recommended you make the following change in your medication:  1.  STOP Simvastatin  We have sent in the refill for Amoxicillin to [facility] in [place]   *If you need a refill on your cardiac medications before your next appointment, please call your pharmacy*   Lab Work: None ordered  If you have labs (blood work) drawn today and your tests are completely normal, you will receive your results only by: Marland Kitchen MyChart Message (if you have MyChart) OR . A paper copy in the mail If you have any lab test that is abnormal or we need to change your treatment, we will call you to review the results.   Testing/Procedures: None ordered   Follow-Up: At [facility], you and your health needs are our priority.  As part of our continuing mission to provide you with exceptional heart care, we have created designated Provider Care Teams.  These Care Teams include your primary Cardiologist (physician) and Advanced Practice Providers (APPs -  Physician Assistants and Nurse Practitioners) who all work together to provide you with the care you need, when you need it.  We recommend signing up for the patient portal called "MyChart".  Sign up information is provided on this After Visit Summary.  MyChart is used to connect with patients for Virtual Visits (Telemedicine).  Patients are able to view lab/test results, encounter notes, upcoming appointments, etc.  Non-urgent messages can be sent to your provider as well.   To learn more about what you can do with MyChart, go to [email or web address].    Your next appointment:   6 month(s)  The format for your next appointment:   In Person  Provider:   You may see Charlton Haws, MD or one of the following Advanced Practice Providers on your designated Care Team:    Norma Fredrickson, NP  Nada Boozer, NP  Georgie Chard, NP    Other Instructions Endocarditis Information  You may be at risk for developing  endocarditis since you have an artificial heart valve or a repaired heart valve. Endocarditis is an infection of the lining of the heart or heart valves. Certain surgical and dental procedures may put you at risk, such as teeth cleaning or other dental procedures or other medical procedures. Notify your doctor or dentist before having any invasive or surgical procedures. You will need to take antibiotics before certain procedures. To prevent endocarditis, maintain good oral health. Seek prompt medical attention for any mouth/gum, skin or urinary tract infections.  You need to check your blood pressure to get an idea of what it's running outside the office. I would recommend using a blood pressure cuff that goes on your arm. The wrist ones can be inaccurate. If possible, try to select one that also reports your heart rate. To check your blood pressure, choose a time at least 3 hours after taking your blood pressure medicines. If you can sample it at different times of the day, that's great - it might give you more information about how your blood pressure fluctuates. Remain seated in a chair for 5 minutes quietly beforehand, then check it. Please record a list of those readings and call your primary care doctor after 4 days of readings.

## 2020-01-14 ENCOUNTER — Telehealth: Payer: Self-pay | Admitting: Physician Assistant

## 2020-01-14 NOTE — Telephone Encounter (Signed)
   Please reach out to primary care again next week to see about getting copy of their lab results -  Let them know we recommended stopping simvastatin given drug interaction with amlodipine and recommend they review potential alternative statin when they get his lab results since I am concerned we may not hear back from them ourselves. I cc'd my note to them and also recommended that if his platelet count is still low they should consider referral to hematology.  On our end also of note simvastatin still showing on MAR but AVS did say to stop and patient was aware to stop. Khanh Tanori PA-C

## 2020-01-17 NOTE — Telephone Encounter (Signed)
Call placed to pt's PCP, [facility].  Spoke with Morrie Sheldon.  She will make pt's PCP aware of Simvastatin being stopped due to drug interaction and she will also fax lab results when they get them.

## 2020-01-18 NOTE — Telephone Encounter (Signed)
Thank you! If you see them come through before my next office visit please let know the values through phone note or have medical records scan to my inbox. Appreciate your help.

## 2020-01-31 NOTE — Telephone Encounter (Signed)
Placed another call to [facility] to check on pt's lab results. Left a message for them to call back.

## 2020-05-24 ENCOUNTER — Telehealth: Payer: Self-pay | Admitting: Cardiovascular Disease

## 2020-05-24 NOTE — Telephone Encounter (Signed)
Pt scheduled 8/9 with Dayna Dunn.

## 2020-05-24 NOTE — Telephone Encounter (Signed)
PT STATES THAT HE NEEDS TO SEE CARDIOLOGIST ABOUT ECHO AND APPT, NO ORDER ON FILE FROM PCP, PCP Clifton Custard, Caren T, MD) WILL SEND OVER COPY OF RESULTS. PT WOULD LIKE TO BE SCH ON A TUES OR THURSDAY AROUND NOON

## 2020-05-30 ENCOUNTER — Telehealth: Payer: Self-pay | Admitting: Physician Assistant

## 2020-05-30 NOTE — Telephone Encounter (Signed)
Call placed to [facility], Dr. Charlestine Night office.  Left a message with pt's name and date of birth, requesting a call back.

## 2020-05-30 NOTE — Telephone Encounter (Signed)
   I received notification that primary care labs were faxed to cath lab from 05/11/20. Reviewed: TChol 126, LDL 65, HDL 45, TSH wnl, K 5.3, Cr 1.78 (scan quality poor), Hgb 11.2, plt 95. (Hgb/plt stable from 2020)  Please place call to primary care: - Per last OV note, we had recommended he talk to PCP about switching simvastatin to another statin because of the drug interaction with amlodipine. For our records, is he now on a different statin?  - By the 5/5 labs, potassium was borderline elevated and creatinine higher than prior so please make sure the ordering provider acknowledged and enacted a plan to address. (If they need suggestions, would suggest they consider decreasing losartan and increasing amlodipine if he needs this for blood pressure control - as long as he is not on simvastatin anymore.)  Thank you! Karmin Kasprzak

## 2020-06-13 NOTE — Telephone Encounter (Signed)
Jason Anthony, CMA, from Dr. Charlestine Night office called back. Pt is on Atorvastatin 10 mg daily effective 05/03/2020 And as far as labs dated 05/11/2020, they have referred the pt to a Nephrologists on 05/25/2020.  I did ask if they were going to monitor pt's bp and kidney function until he gets in with Nephrologist, as it can take some time, and she advised me that they will repeat his labs next week, and fax [place] a copy over.

## 2020-06-13 NOTE — Telephone Encounter (Signed)
Dr. Charlestine Night office returned call.  Phone #: [phone]

## 2020-06-13 NOTE — Telephone Encounter (Signed)
Call placed again to [facility], left another detailed message on voicemail.

## 2020-06-13 NOTE — Telephone Encounter (Signed)
Returned call back to [facility].  Left another detailed message to call back, also asked them to ask for Garner Gavel.

## 2020-08-14 NOTE — Progress Notes (Signed)
Cardiology Office Note    Date:  08/15/2020   ID:  Jason Anthony, DOB 15-Feb-1945, MRN [id number]  PCP:  Jason Person, MD  Cardiologist:  Charlton Haws, MD  Electrophysiologist:  None   Chief Complaint: f/u TAVR, CAD  History of Present Illness:   Jason Anthony is a 75 y.o. 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 Last 2D echo 06/2019 showed EF 65-70%, grade 2 DD, severe LAE, s/p TAVR with mild perivalvular leak, mild dilation of aortic root and ascending aorta - when compared to study of 09/2018, the peak and mean transaortic gradients had decreased from 34/28mmHg to 20/30mmHg and visually perivalvular AI appeared the same. Regarding his sarcoidosis, he has a long history of this with scarring of the lung by prior imaging and has not wanted to follow up with pulmonology. He saw them very remotely per his report in [place]. He also has a history of thrombocytopenia hence why he is no longer on Plavix. Simvastatin was changed to atorvastatin earlier this year due to drug interaction with amlodipine.  He is seen back for follow-up today feeling well without any cardiac concerns. No CP, SOB, palpitations, edema, orthopnea or dizziness. He has an appt upcoming soon to establish care with a nephrologist. He had bloodwork in May showing mild hyperkalemia as outlined below but denies any med changes at that time. He still works at [employer] 4 days a week.  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   29 mm Edwards Sapien 3 transcatheter heart valve placed via percutaneous right transfemoral approach    Sarcoidosis    Severe aortic stenosis    s/p TAVR 05/2017   Thrombocytopenia (HCC)     Past Surgical History:  Procedure Laterality Date   CARDIAC CATHETERIZATION  04/30/2017   COLONOSCOPY     HAMMER TOE SURGERY     "?toe; ?side"   INGUINAL HERNIA REPAIR Right    INTRAOPERATIVE TRANSTHORACIC ECHOCARDIOGRAM N/A 06/03/2017   Procedure: INTRAOPERATIVE TRANSTHORACIC ECHOCARDIOGRAM;  Surgeon: Kathleene Hazel, MD;  Location: [facility] OR;  Service: Open Heart Surgery;  Laterality: N/A;   LAPAROSCOPIC CHOLECYSTECTOMY     RIGHT/LEFT HEART CATH AND CORONARY ANGIOGRAPHY N/A 04/30/2017   Procedure: RIGHT/LEFT HEART CATH AND CORONARY ANGIOGRAPHY;  Surgeon: Kathleene Hazel, MD;  Location: [facility] INVASIVE CV LAB;  Service: Cardiovascular;  Laterality: N/A;   SALIVARY STONE REMOVAL  1961   "spit gland had a stone on it"   TRANSCATHETER AORTIC VALVE REPLACEMENT, TRANSFEMORAL N/A 06/03/2017   Procedure: TRANSCATHETER AORTIC VALVE REPLACEMENT, TRANSFEMORAL;  Surgeon: Kathleene Hazel, MD;  Location: [facility] OR;  Service: Open Heart Surgery;  Laterality: N/A;    Current Medications: Current Meds  Medication Sig   amLODipine (NORVASC) 5 MG tablet  Take 5 mg by mouth daily.   amoxicillin (AMOXIL) 500 MG tablet TAKE 4 TABLETS ONE HOUR BEFORE ANY DENTAL PROCEDURE.   aspirin EC 81 MG tablet Take 81 mg by mouth daily.   atorvastatin (LIPITOR) 10 MG tablet Take 10 mg by mouth daily.   clonazePAM (KLONOPIN) 0.5 MG tablet Take 0.5 mg by mouth 2 (two) times daily as needed for anxiety.   fluticasone (FLONASE) 50 MCG/ACT nasal spray    losartan (COZAAR) 50 MG tablet Take 50 mg  by mouth in the morning and at bedtime.   sertraline (ZOLOFT) 50 MG tablet Take by mouth daily.   traZODone (DESYREL) 50 MG tablet Take 100 mg by mouth at bedtime.       Allergies:   Patient has no known allergies.   Social History   Socioeconomic History   Marital status: Divorced    Spouse name: Not on file   Number of children: Not on file   Years of education: Not on file   Highest education level: Not on file  Occupational History   Occupation: [employer]  Tobacco Use   Smoking status: Never   Smokeless tobacco: Never  Vaping Use   Vaping Use: Never used  Substance and Sexual Activity   Alcohol use: No   Drug use: Never   Sexual activity: Not Currently  Other Topics Concern   Not on file  Social History Narrative   Not on file   Social Determinants of Health   Financial Resource Strain: Not on file  Food Insecurity: Not on file  Transportation Needs: Not on file  Physical Activity: Not on file  Stress: Not on file  Social Connections: Not on file     Family History:  The patient's family history includes Breast cancer in his sister; CVA (age of onset: 63) in his father; Hypertension in his mother.  ROS:   Please see the history of present illness.  All other systems are reviewed and otherwise negative.    EKGs/Labs/Other Studies Reviewed:    Studies reviewed are outlined and summarized above. Reports included below if pertinent.  2D echo 07/01/19    1. Left ventricular ejection fraction, by estimation, is 65 to 70%. The  left ventricle has normal function. The left ventricle has no regional  wall motion abnormalities. Left ventricular diastolic parameters are  consistent with Grade II diastolic  dysfunction (pseudonormalization). Elevated left ventricular end-diastolic  pressure.   2. Right ventricular systolic function is normal. The right ventricular  size is normal. There is normal pulmonary artery systolic pressure.   3. Left atrial size was  severely dilated.   4. The mitral valve is normal in structure. Trivial mitral valve  regurgitation. No evidence of mitral stenosis.   5. There is a 29 mm Edwards Sapien prosthetic, stented (TAVR) valve  present in the aortic posiion. Echo findings are consistent with normal  structure and function of the aortic valve prosthesis. Mild perivalvular  leak seen in PSSA view at 1 o'clock.  Visually this appears mild but by PHT is moderate at . No aortic  stenosis is present. Aortic valve mean gradient measures 11.0 mmHg. Aortic  valve peak gradient measures 20.2 mmHg. Aortic valve area, by VTI measures  3.22 cm. DI 0.61.   6. Aortic dilatation noted. There is mild dilatation of the aortic root  and of the ascending aorta measuring 37 mm and 1mm repsectively.   7. The inferior vena cava is normal in size with greater than 50%  respiratory variability, suggesting right atrial pressure of 3 mmHg.   8. WHen compared to study of 09/2018, the peak and mean transaortic  gradients have decreased from 34/75mmHg to 20/60mmHg and visually  perivalvular AI appears the same.   [facility] 04/2017 Ost RCA to Prox RCA lesion is 30% stenosed. Prox RCA to Mid RCA lesion is 20% stenosed. Ost RPDA lesion is 30% stenosed. Prox Cx lesion is 60% stenosed. Dist LAD lesion is 80% stenosed. Prox LAD lesion is 50% stenosed. Prox LAD to Mid LAD lesion is 30% stenosed. Ost 1st Diag lesion is 60% stenosed. There is severe aortic valve stenosis. Ost 2nd Mrg lesion is 30% stenosed. Mid Cx lesion is 30% stenosed.   1. The Left main has no obstructive disease 2. The LAD is a large caliber vessel in the proximal and mid vessel and then becomes smaller in caliber in the distal vessel. The mid LAD has a moderate stenosis that does not appear to be flow limiting. The distal LAD is smaller in caliber. There is a focal severe stenosis in the distal LAD. This vessel appears to be too small for stenting. 3. The Circumflex is  a large caliber vessel with a moderate proximal stenosis just before the takeoff of a large obtuse marginal branch. The large caliber first obtuse marginal branch is free of obstructive disease. 4. The RCA is a large, dominant vessel with mild ostial stenosis, mild disease in the mid and distal vessel. There is mild disease in the moderate caliber PDA. 5. Severe aortic stenosis (mean gradient 42.1 mmHg, peak to peak gradient 49 mmHg, AVA 1.2 cm2).   Recommendations: I think his CAD can be managed at this time with medical therapy. He is having no angina. The LAD lesion is distal and the vessel is small in caliber (not favorable for stenting) and this only supplies the apical segment of myocardium. The Circumflex lesion is moderate. Will proceed with workup for AVR vs TAVR. He has an appointment with Dr. Cornelius Moras next week. I suspect that he will be a good TAVR candidate.      EKG:  EKG is ordered today, personally reviewed, demonstrating NSR 6bpm, firstt depress AVB, LAD, nonspecific TW changes, poor R wave progression, NSIVCD, similar to prior  Recent Labs: No results found for requested labs within last 8760 hours.  Recent Lipid Panel No results found for: CHOL, TRIG, HDL, CHOLHDL, VLDL, LDLCALC, LDLDIRECT  PHYSICAL EXAM:    VS:  BP (!) 130/58   Pulse 66   Ht 6\' 2"  (1.88 m)   Wt 205 lb 12.8 oz (93.4 kg)   SpO2 98%   BMI 26.42 kg/m   BMI: Body mass index is 26.42 kg/m.  GEN: Well nourished, well developed male in no acute distress HEENT: normocephalic, atraumatic Neck: no JVD, carotid bruits, or masses Cardiac: RRR; soft SEM, no rubs or gallops, no edema  Respiratory:  clear to auscultation bilaterally, normal work of breathing GI: soft, nontender, nondistended, + BS MS: no deformity or atrophy Skin: warm and dry, no rash Neuro:  Alert and Oriented x 3, Strength and sensation are intact, follows commands Psych: euthymic mood, full affect  Wt Readings from Last 3 Encounters:   08/15/20 205 lb 12.8 oz (93.4 kg)  01/13/20 209 lb 12.8 oz (95.2 kg)  07/01/19 196 lb (88.9 kg)     ASSESSMENT & PLAN:   1. Severe AS s/p TAVR - symptomatically stable. Per d/w echo team, echo should be done annually post TAVR so  will go ahead and arrange (last study 06/2019). SBE ppx reviewed - he has amoxicillin on hand already but will notify if he needs any future refills. Continue low dose ASA. He does have hx of mild anemia/thrombocytopenia. I offered hematology referral as previously suggested but he wants to wait until OV with PCP next month to discuss with their team [facility] [place] [facility]  2. CAD - no recent anginal symptoms. Continue low dose ASA as tolerated and statin.  3. Essential HTN - BP controlled, no changes made today but need to f/u BMET for recent hyperkalemia earlier this year. If needed, consideration could be given to decreasing losartan and increasing amlodipine.  4. Hyperlipidemia - lipids are managed by primary care, switched previously from simvastatin to atorvastatin. LDL at goal by follow-up labs in May.  5. Hyperkalemia - recheck BMET today.  Disposition: F/u with Dr. Eden EmmsNishan in 6 months.  Medication Adjustments/Labs and Tests Ordered: Current medicines are reviewed at length with the patient today.  Concerns regarding medicines are outlined above. Medication changes, Labs and Tests ordered today are summarized above and listed in the Patient Instructions accessible in Encounters.   Signed, Laurann Montanaayna N Aziza Stuckert, PA-C  08/15/2020 3:21 PM    [facility] [address], [place], [place]  [address] Phone: [phone]; Fax: [phone]) [phone]

## 2020-08-15 ENCOUNTER — Encounter: Payer: Self-pay | Admitting: Physician Assistant

## 2020-08-15 ENCOUNTER — Ambulatory Visit: Payer: Medicare PPO | Admitting: Physician Assistant

## 2020-08-15 ENCOUNTER — Other Ambulatory Visit: Payer: Self-pay

## 2020-08-15 VITALS — BP 130/58 | HR 66 | Ht 74.0 in | Wt 205.8 lb

## 2020-08-15 DIAGNOSIS — E785 Hyperlipidemia, unspecified: Secondary | ICD-10-CM

## 2020-08-15 DIAGNOSIS — I10 Essential (primary) hypertension: Secondary | ICD-10-CM | POA: Diagnosis not present

## 2020-08-15 DIAGNOSIS — E875 Hyperkalemia: Secondary | ICD-10-CM

## 2020-08-15 DIAGNOSIS — Z952 Presence of prosthetic heart valve: Secondary | ICD-10-CM

## 2020-08-15 DIAGNOSIS — I2510 Atherosclerotic heart disease of native coronary artery without angina pectoris: Secondary | ICD-10-CM

## 2020-08-15 DIAGNOSIS — I350 Nonrheumatic aortic (valve) stenosis: Secondary | ICD-10-CM | POA: Diagnosis not present

## 2020-08-15 NOTE — Patient Instructions (Addendum)
Medication Instructions:  Your physician recommends that you continue on your current medications as directed. Please refer to the Current Medication list given to you today.  *If you need a refill on your cardiac medications before your next appointment, please call your pharmacy*   Lab Work: TODAY:  BMET  If you have labs (blood work) drawn today and your tests are completely normal, you will receive your results only by: MyChart Message (if you have MyChart) OR A paper copy in the mail If you have any lab test that is abnormal or we need to change your treatment, we will call you to review the results.   Testing/Procedures: Your physician has requested that you have an echocardiogram. Echocardiography is a painless test that uses sound waves to create images of your heart. It provides your doctor with information about the size and shape of your heart and how well your heart's chambers and valves are working. This procedure takes approximately one hour. There are no restrictions for this procedure.    Follow-Up: At [facility], you and your health needs are our priority.  As part of our continuing mission to provide you with exceptional heart care, we have created designated Provider Care Teams.  These Care Teams include your primary Cardiologist (physician) and Advanced Practice Providers (APPs -  Physician Assistants and Nurse Practitioners) who all work together to provide you with the care you need, when you need it.  We recommend signing up for the patient portal called "MyChart".  Sign up information is provided on this After Visit Summary.  MyChart is used to connect with patients for Virtual Visits (Telemedicine).  Patients are able to view lab/test results, encounter notes, upcoming appointments, etc.  Non-urgent messages can be sent to your provider as well.   To learn more about what you can do with MyChart, go to [email or web address].    Your next appointment:   6  month(s)  The format for your next appointment:   In Person  Provider:   Charlton Haws, MD   Other Instructions  When you see your primary care doctor in follow-up, please ask them about a potential referral to a hematologist (blood doctor) for your low platelet count.

## 2020-08-16 ENCOUNTER — Other Ambulatory Visit: Payer: Self-pay | Admitting: *Deleted

## 2020-08-16 ENCOUNTER — Telehealth: Payer: Self-pay | Admitting: *Deleted

## 2020-08-16 DIAGNOSIS — Z79899 Other long term (current) drug therapy: Secondary | ICD-10-CM

## 2020-08-16 LAB — BASIC METABOLIC PANEL
BUN/Creatinine Ratio: 10 (ref 10–24)
BUN: 17 mg/dL (ref 8–27)
CO2: 24 mmol/L (ref 20–29)
Calcium: 9.3 mg/dL (ref 8.6–10.2)
Chloride: 109 mmol/L — ABNORMAL HIGH (ref 96–106)
Creatinine, Ser: 1.65 mg/dL — ABNORMAL HIGH (ref 0.76–1.27)
Glucose: 100 mg/dL — ABNORMAL HIGH (ref 65–99)
Potassium: 5.3 mmol/L — ABNORMAL HIGH (ref 3.5–5.2)
Sodium: 145 mmol/L — ABNORMAL HIGH (ref 134–144)
eGFR: 43 mL/min/{1.73_m2} — ABNORMAL LOW (ref >59)

## 2020-08-16 MED ORDER — AMLODIPINE BESYLATE 10 MG PO TABS: 10.0000 mg | ORAL_TABLET | Freq: Every day | ORAL | 3 refills | Status: AC

## 2020-08-16 MED ORDER — LOSARTAN POTASSIUM 25 MG PO TABS: 25.0000 mg | ORAL_TABLET | Freq: Every day | ORAL | 3 refills | Status: AC

## 2020-08-16 NOTE — Telephone Encounter (Signed)
-----   Message from Laurann Montana, [place] sent at 08/16/2020  7:29 AM EDT ----- Please let patient know kidney function shows slight increase from prior and potassium level is still elevated similar to May of this year. Recommend decrease losartan to 25mg  once daily, increase amlodipine to 10mg  daily, and recheck BMET in 1 week. If potassium is still high at that time, will need to stop losartan. He otherwise already has f/u with PCP/nephrology coming up soon at which time blood pressure can be re-evaluated (he lives far from our office so not ideal to come back in just for a BP check). If he is eating any excess amt of these foods, try to limit: bananas, squash, yogurt, white beans, sweet potatoes, leafy greens, and avocados. Also avoid any potassium supplements or salt-substitutes with potassium in them.

## 2020-08-22 LAB — BASIC METABOLIC PANEL
BUN/Creatinine Ratio: 11 (ref 10–24)
BUN: 17 mg/dL (ref 8–27)
CO2: 23 mmol/L (ref 20–29)
Calcium: 9.2 mg/dL (ref 8.6–10.2)
Chloride: 104 mmol/L (ref 96–106)
Creatinine, Ser: 1.5 mg/dL — ABNORMAL HIGH (ref 0.76–1.27)
Glucose: 85 mg/dL (ref 65–99)
Potassium: 4.3 mmol/L (ref 3.5–5.2)
Sodium: 140 mmol/L (ref 134–144)
eGFR: 48 mL/min/{1.73_m2} — ABNORMAL LOW (ref >59)

## 2020-08-23 NOTE — Progress Notes (Signed)
Pt has been made aware of normal result and verbalized understanding.  jw

## 2020-09-07 ENCOUNTER — Other Ambulatory Visit: Payer: Self-pay

## 2020-09-07 ENCOUNTER — Ambulatory Visit (HOSPITAL_COMMUNITY): Payer: Medicare PPO | Attending: Cardiology

## 2020-09-07 DIAGNOSIS — E785 Hyperlipidemia, unspecified: Secondary | ICD-10-CM | POA: Insufficient documentation

## 2020-09-07 DIAGNOSIS — I350 Nonrheumatic aortic (valve) stenosis: Secondary | ICD-10-CM | POA: Insufficient documentation

## 2020-09-07 DIAGNOSIS — E875 Hyperkalemia: Secondary | ICD-10-CM | POA: Diagnosis present

## 2020-09-07 DIAGNOSIS — Z952 Presence of prosthetic heart valve: Secondary | ICD-10-CM | POA: Diagnosis present

## 2020-09-07 DIAGNOSIS — I10 Essential (primary) hypertension: Secondary | ICD-10-CM

## 2020-09-07 DIAGNOSIS — I2510 Atherosclerotic heart disease of native coronary artery without angina pectoris: Secondary | ICD-10-CM | POA: Diagnosis present

## 2020-09-07 LAB — ECHOCARDIOGRAM COMPLETE
AR max vel: 3.31 cm2
AV Area VTI: 3.55 cm2
AV Area mean vel: 3.36 cm2
AV Mean grad: 12 mmHg
AV Peak grad: 24.3 mmHg
Ao pk vel: 2.47 m/s
Area-P 1/2: 1.86 cm2
P 1/2 time: 419 msec
S' Lateral: 2.65 cm

## 2020-09-29 ENCOUNTER — Telehealth: Payer: Self-pay

## 2020-09-29 NOTE — Telephone Encounter (Signed)
Received fax from Edd Fabian NP asking Dr. Fabio Bering to approve patient starting Primidone at patient's request. Dr. Eden Emms stated Primidone is okay. Left message with Lyndsey's office to call back.

## 2020-10-04 NOTE — Telephone Encounter (Signed)
Left message for Bonnie to call back. 

## 2020-10-04 NOTE — Telephone Encounter (Signed)
  Jason Anthony's office returning call

## 2020-10-06 NOTE — Telephone Encounter (Signed)
Lupita Leash with [facility] office called back. Informed her that Dr. Eden Emms stated Primidone is okay. She will relay the message to [place].

## 2020-10-24 ENCOUNTER — Telehealth: Payer: Self-pay | Admitting: Cardiovascular Disease

## 2020-10-24 NOTE — Telephone Encounter (Signed)
Pt c/o medication issue:  1. Name of Medication: losartan (COZAAR) 25 MG tablet  2. How are you currently taking this medication (dosage and times per day)? 25 mg daily   3. Are you having a reaction (difficulty breathing--STAT)?   4. What is your medication issue? Patient went to Jason Anthony and was told to cut this medication in half because it was affecting his kidneys. The patient wanted to know what Dr. Eden Emms wants him to do

## 2020-10-25 NOTE — Telephone Encounter (Signed)
Left message for patient to call back  

## 2020-10-25 NOTE — Telephone Encounter (Signed)
Patient called back. Per Dr. Eden Emms, okay for patient to take 1/2 tablet of Losartan 25 mg.

## 2020-11-02 ENCOUNTER — Telehealth: Payer: Self-pay | Admitting: Cardiovascular Disease

## 2020-11-02 NOTE — Telephone Encounter (Signed)
Called patient to let him know that we have not received anything yet.

## 2020-11-02 NOTE — Telephone Encounter (Signed)
Patient states his kidney specialist sent his lab work to the office and would like to know if it was received.

## 2020-11-08 ENCOUNTER — Telehealth: Payer: Self-pay | Admitting: Cardiovascular Disease

## 2020-11-08 NOTE — Telephone Encounter (Signed)
   Patient Name: Jason Anthony  DOB: 14-Aug-1945 MRN: [id number]  Primary Cardiologist: Charlton Haws, MD  Chart reviewed as part of pre-operative protocol coverage.   Simple dental extractions are considered low risk procedures per guidelines and generally do not require any specific cardiac clearance. It is also generally accepted that for simple extractions and dental cleanings, there is no need to interrupt blood thinner therapy.  SBE prophylaxis is required for the patient from a cardiac standpoint. He has a history of TAVR and has amoxicillin available.   I will route this recommendation to the requesting party via Epic fax function and remove from pre-op pool.  Please call with questions.  Roe Rutherford Tascha Casares, PA 11/08/2020, 12:51 PM

## 2020-11-08 NOTE — Telephone Encounter (Signed)
   [facility] Pre-operative Risk Assessment    Request for surgical clearance:  What type of surgery is being performed?  DENTAL CLEANING  When is this surgery scheduled?  01/11/2021  What type of clearance is required (medical clearance vs. Pharmacy clearance to hold med vs. Both)? BOTH  Are there any medications that need to be held prior to surgery and how long? BLOOD THINNER IF PT IS TAKING ONE. PT DOES NOT KNOW IF PT IS TAKING A BLOOD THINNER  Practice name and name of physician performing surgery? [facility]   What is your office phone number [phone]   7.   What is your office fax number [phone]  8.   Anesthesia type (None, local, MAC, general) ? NONE    Larina Bras 11/08/2020, 11:28 AM  _________________________________________________________________   (provider comments below)

## 2020-12-11 NOTE — Telephone Encounter (Signed)
Dental office was calling in regards to the clearance for this patient. She has not received anything from our office yet. She needs to know if the patient needs pre-meds. He is scheduled for his cleaning 01/11/21  Please re-fax clearance to [phone]

## 2021-02-19 ENCOUNTER — Telehealth: Payer: Self-pay | Admitting: Cardiovascular Disease

## 2021-02-19 NOTE — Telephone Encounter (Signed)
Patient is calling to talk with Dr. Eden Emms or nurse. Please call back

## 2021-02-19 NOTE — Telephone Encounter (Signed)
Dr. Amado Coe GI doctor wants patient to start ursodiol 250 mg take 3 tablets BID for 90 day. Patient stated Dr. Amado Coe can be reached at [phone]. Will send note to our PharmD for advisement if okay for patient to take with the rest of his medications. Patient stated he is not sure why he is taking medication, but says he thinks it is for his liver.

## 2021-02-20 NOTE — Telephone Encounter (Signed)
Ok to take, no interactions with his other meds and no major cardiac side effects of concern. Would update his med list if he starts on therapy.

## 2021-02-22 NOTE — Telephone Encounter (Signed)
Called patient with recommendations form our Pharmacist. Patient verbalized understanding and his medication list was updated.

## 2021-03-03 NOTE — Progress Notes (Incomplete)
Cardiology Office Note    Date:  03/03/2021   ID:  Jason Anthony, DOB 1946-01-01, MRN [id number]  PCP:  Joseph Art, MD  Cardiologist:  Jenkins Rouge, MD   Chief Complaint: f/u TAVR, CAD  History of Present Illness:   Jason Anthony is a 76 y.o. 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   TTE done 09/07/20 with 29 mm Sapien 3 valve mean gradient stable 12 mmHg peak 24 mmHg and moderate PVL   Has sarcoid followed by pulmonary in [facility] in past and low PLTls now off PLTls deferred hematology referral last visit Most recent PLT 93 no bleeding issues   Simvastatin was changed to atorvastatin earlier this year due to drug interaction with amlodipine.  He still works at [employer] 4 days a week.  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   29 mm Edwards Sapien 3 transcatheter heart valve placed via percutaneous right transfemoral approach    Sarcoidosis    Severe aortic stenosis    s/p TAVR 05/2017   Thrombocytopenia ([place])     Past Surgical History:  Procedure Laterality Date   CARDIAC CATHETERIZATION  04/30/2017   COLONOSCOPY     HAMMER TOE SURGERY     "?toe; ?side"   INGUINAL HERNIA REPAIR Right    INTRAOPERATIVE TRANSTHORACIC ECHOCARDIOGRAM  N/A 06/03/2017   Procedure: INTRAOPERATIVE TRANSTHORACIC ECHOCARDIOGRAM;  Surgeon: Burnell Blanks, MD;  Location: [facility];  Service: Open Heart Surgery;  Laterality: N/A;   LAPAROSCOPIC CHOLECYSTECTOMY     RIGHT/LEFT HEART CATH AND CORONARY ANGIOGRAPHY N/A 04/30/2017   Procedure: RIGHT/LEFT HEART CATH AND CORONARY ANGIOGRAPHY;  Surgeon: Burnell Blanks, MD;  Location: [facility];  Service: Cardiovascular;  Laterality: N/A;   SALIVARY STONE REMOVAL  1961   "spit gland had a stone on it"   TRANSCATHETER AORTIC VALVE REPLACEMENT, TRANSFEMORAL N/A 06/03/2017   Procedure: TRANSCATHETER AORTIC VALVE REPLACEMENT, TRANSFEMORAL;  Surgeon: Burnell Blanks, MD;  Location: [facility];  Service: Open Heart Surgery;  Laterality: N/A;    Current Medications: No outpatient medications have been marked as taking for the 03/08/21 encounter (Appointment) with Josue Hector, MD.      Allergies:   Patient has no known allergies.   Social History   Socioeconomic History   Marital status: Divorced    Spouse name: Not on file   Number of children: Not on file   Years of education: Not on file   Highest education level: Not on file  Occupational History   Occupation: [id number]  Tobacco Use   Smoking status: Never   Smokeless tobacco: Never  Vaping Use   Vaping Use: Never used  Substance and Sexual Activity   Alcohol use: No  Drug use: Never   Sexual activity: Not Currently  Other Topics Concern   Not on file  Social History Narrative   Not on file   Social Determinants of Health   Financial Resource Strain: Not on file  Food Insecurity: Not on file  Transportation Needs: Not on file  Physical Activity: Not on file  Stress: Not on file  Social Connections: Not on file     Family History:  The patient's family history includes Breast cancer in his sister; CVA (age of onset: 8) in his father; Hypertension in his mother.  ROS:   Please see the history of present  illness.  All other systems are reviewed and otherwise negative.    EKGs/Labs/Other Studies Reviewed:    Studies reviewed are outlined and summarized above. Reports included below if pertinent.  2D echo  09/07/20 IMPRESSIONS     1. Left ventricular ejection fraction, by estimation, is 65 to 70%. Left  ventricular ejection fraction by 3D volume is 67 %. The left ventricle has  normal function. The left ventricle has no regional wall motion  abnormalities. The left ventricular  internal cavity size was mildly dilated. Left ventricular diastolic  parameters are consistent with Grade II diastolic dysfunction  (pseudonormalization). Elevated left ventricular end-diastolic pressure.   2. Right ventricular systolic function is normal. The right ventricular  size is normal. Tricuspid regurgitation signal is inadequate for assessing  PA pressure.   3. Left atrial size was severely dilated.   4. The mitral valve is normal in structure. Trivial mitral valve  regurgitation. No evidence of mitral stenosis.   5. The aortic valve has been repaired/replaced. No aortic stenosis is  present. There is a 29 mm Sapien prosthetic, stented (TAVR) valve present  in the aortic position. Aortic valve mean gradient measures 12.0 mmHg.  Aortic valve peak gradient measures  24.3 mmHg. Aortic valve area, by VTI measures 3.55 cm. There is moderate  perivalvular AI by pressure halftime noted at 1pm in the PSAX view.   6. Aortic dilatation noted. There is mild dilatation of the ascending  aorta, measuring 39 mm.   7. The inferior vena cava is normal in size with greater than 50%  respiratory variability, suggesting right atrial pressure of 3 mmHg.   8. Compared to prior echo studay 07/01/2019, the peak and mean transaortic  gradients have increased from 20/11 mmHg to 24/48mmHg, DVI is the same and  perivalvular AI PHT has increased from 355 to 419msec and visually appears  stable.      [facility] 04/2017 Ost RCA to  Prox RCA lesion is 30% stenosed. Prox RCA to Mid RCA lesion is 20% stenosed. Ost RPDA lesion is 30% stenosed. Prox Cx lesion is 60% stenosed. Dist LAD lesion is 80% stenosed. Prox LAD lesion is 50% stenosed. Prox LAD to Mid LAD lesion is 30% stenosed. Ost 1st Diag lesion is 60% stenosed. There is severe aortic valve stenosis. Ost 2nd Mrg lesion is 30% stenosed. Mid Cx lesion is 30% stenosed.   1. The Left main has no obstructive disease 2. The LAD is a large caliber vessel in the proximal and mid vessel and then becomes smaller in caliber in the distal vessel. The mid LAD has a moderate stenosis that does not appear to be flow limiting. The distal LAD is smaller in caliber. There is a focal severe stenosis in the distal LAD. This vessel appears to be too small for stenting. 3. The Circumflex is a large caliber vessel with  a moderate proximal stenosis just before the takeoff of a large obtuse marginal branch. The large caliber first obtuse marginal branch is free of obstructive disease. 4. The RCA is a large, dominant vessel with mild ostial stenosis, mild disease in the mid and distal vessel. There is mild disease in the moderate caliber PDA. 5. Severe aortic stenosis (mean gradient 42.1 mmHg, peak to peak gradient 49 mmHg, AVA 1.2 cm2).   Recommendations: I think his CAD can be managed at this time with medical therapy. He is having no angina. The LAD lesion is distal and the vessel is small in caliber (not favorable for stenting) and this only supplies the apical segment of myocardium. The Circumflex lesion is moderate. Will proceed with workup for AVR vs TAVR. He has an appointment with Dr. Roxy Manns next week. I suspect that he will be a good TAVR candidate.      EKG:  EKG is ordered today, personally reviewed, demonstrating NSR 6bpm, firstt depress AVB, LAD, nonspecific TW changes, poor R wave progression, NSIVCD, similar to prior  Recent Labs: 08/22/2020: BUN 17; Creatinine, Ser 1.50;  Potassium 4.3; Sodium 140  Recent Lipid Panel No results found for: CHOL, TRIG, HDL, CHOLHDL, VLDL, LDLCALC, LDLDIRECT  PHYSICAL EXAM:    VS:  There were no vitals taken for this visit.  BMI: There is no height or weight on file to calculate BMI.  Affect appropriate Healthy:  appears stated age 34: normal Neck supple with no adenopathy JVP normal no bruits no thyromegaly Lungs clear with no wheezing and good diaphragmatic motion Heart:  S1/S2  SEM / AR murmur, no rub, gallop or click PMI normal Abdomen: benighn, BS positve, no tenderness, no AAA no bruit.  No HSM or HJR Distal pulses intact with no bruits No edema Neuro non-focal Skin warm and dry No muscular weakness   Wt Readings from Last 3 Encounters:  08/15/20 205 lb 12.8 oz (93.4 kg)  01/13/20 209 lb 12.8 oz (95.2 kg)  07/01/19 196 lb (88.9 kg)     ASSESSMENT & PLAN:   1. Severe AS s/p TAVR -  29 mm Sapien 3 06/03/17 with stable low gradients but moderate PVL update echo September 2023   2. CAD - no recent anginal symptoms. Continue low dose ASA as tolerated and statin. Cath 04/30/17 80% distal LAD 60% D1 and 60% mid LCX  3. Essential HTN - Well controlled.  Continue current medications and low sodium Dash type diet.    4. Hyperlipidemia - lipids are managed by primary care, switched previously from simvastatin to atorvastatin. LDL at goal by follow-up labs in May.  TTE September 2023   Disposition: F/u in a year   Medication Adjustments/Labs and Tests Ordered: Current medicines are reviewed at length with the patient today.  Concerns regarding medicines are outlined above. Medication changes, Labs and Tests ordered today are summarized above and listed in the Patient Instructions accessible in Encounters.   Signed, Jenkins Rouge, MD  03/03/2021 6:15 PM    [facility] [address], [place], [place]  [address] Phone: [phone]; Fax: [phone]

## 2021-03-08 ENCOUNTER — Emergency Department (HOSPITAL_COMMUNITY): Payer: Medicare PPO

## 2021-03-08 ENCOUNTER — Encounter (HOSPITAL_COMMUNITY): Payer: Self-pay

## 2021-03-08 ENCOUNTER — Ambulatory Visit: Payer: Medicare PPO | Admitting: Cardiovascular Disease

## 2021-03-08 ENCOUNTER — Emergency Department (HOSPITAL_COMMUNITY)
Admission: EM | Admit: 2021-03-08 | Discharge: 2021-03-08 | Disposition: A | Discharge status: Home / Self Care | Payer: Medicare PPO | Attending: Emergency Medicine | Admitting: Emergency Medicine

## 2021-03-08 ENCOUNTER — Telehealth: Payer: Self-pay | Admitting: Cardiovascular Disease

## 2021-03-08 ENCOUNTER — Other Ambulatory Visit: Payer: Self-pay

## 2021-03-08 DIAGNOSIS — I6529 Occlusion and stenosis of unspecified carotid artery: Secondary | ICD-10-CM | POA: Diagnosis not present

## 2021-03-08 DIAGNOSIS — D860 Sarcoidosis of lung: Secondary | ICD-10-CM | POA: Insufficient documentation

## 2021-03-08 DIAGNOSIS — Z7982 Long term (current) use of aspirin: Secondary | ICD-10-CM | POA: Insufficient documentation

## 2021-03-08 DIAGNOSIS — R0789 Other chest pain: Secondary | ICD-10-CM

## 2021-03-08 DIAGNOSIS — Z79899 Other long term (current) drug therapy: Secondary | ICD-10-CM | POA: Insufficient documentation

## 2021-03-08 DIAGNOSIS — I10 Essential (primary) hypertension: Secondary | ICD-10-CM | POA: Insufficient documentation

## 2021-03-08 DIAGNOSIS — R791 Abnormal coagulation profile: Secondary | ICD-10-CM | POA: Insufficient documentation

## 2021-03-08 DIAGNOSIS — E7800 Pure hypercholesterolemia, unspecified: Secondary | ICD-10-CM | POA: Diagnosis not present

## 2021-03-08 DIAGNOSIS — R0602 Shortness of breath: Secondary | ICD-10-CM | POA: Diagnosis not present

## 2021-03-08 DIAGNOSIS — R079 Chest pain, unspecified: Secondary | ICD-10-CM | POA: Insufficient documentation

## 2021-03-08 LAB — BASIC METABOLIC PANEL
Anion gap: 9 (ref 5–15)
BUN: 23 mg/dL (ref 8–23)
CO2: 25 mmol/L (ref 22–32)
Calcium: 9.3 mg/dL (ref 8.9–10.3)
Chloride: 105 mmol/L (ref 98–111)
Creatinine, Ser: 1.7 mg/dL — ABNORMAL HIGH (ref 0.61–1.24)
GFR, Estimated: 41 mL/min — ABNORMAL LOW (ref >60)
Glucose, Bld: 189 mg/dL — ABNORMAL HIGH (ref 70–99)
Potassium: 4.3 mmol/L (ref 3.5–5.1)
Sodium: 139 mmol/L (ref 135–145)

## 2021-03-08 LAB — TROPONIN I (HIGH SENSITIVITY)
Troponin I (High Sensitivity): 6 ng/L (ref <18)
Troponin I (High Sensitivity): 7 ng/L (ref <18)

## 2021-03-08 LAB — CBC
HCT: 37.6 % — ABNORMAL LOW (ref 39.0–52.0)
Hemoglobin: 12.6 g/dL — ABNORMAL LOW (ref 13.0–17.0)
MCH: 32.9 pg (ref 26.0–34.0)
MCHC: 33.5 g/dL (ref 30.0–36.0)
MCV: 98.2 fL (ref 80.0–100.0)
Platelets: 99 10*3/uL — ABNORMAL LOW (ref 150–400)
RBC: 3.83 MIL/uL — ABNORMAL LOW (ref 4.22–5.81)
RDW: 12.6 % (ref 11.5–15.5)
WBC: 9.9 10*3/uL (ref 4.0–10.5)
nRBC: 0 % (ref 0.0–0.2)

## 2021-03-08 LAB — D-DIMER, QUANTITATIVE: D-Dimer, Quant: 0.93 ug/mL-FEU — ABNORMAL HIGH (ref 0.00–0.50)

## 2021-03-08 LAB — BRAIN NATRIURETIC PEPTIDE: B Natriuretic Peptide: 123 pg/mL — ABNORMAL HIGH (ref 0.0–100.0)

## 2021-03-08 MED ORDER — NITROGLYCERIN 0.4 MG SL SUBL: 0.4000 mg | SUBLINGUAL_TABLET | SUBLINGUAL | Status: DC | PRN

## 2021-03-08 MED ORDER — IOHEXOL 350 MG/ML SOLN
75.0000 mL | Freq: Once | INTRAVENOUS | Status: AC | PRN
Start: 2021-03-08 — End: 2021-03-08
  Administered 2021-03-08: 75 mL via INTRAVENOUS

## 2021-03-08 MED ORDER — ASPIRIN 81 MG PO CHEW
324.0000 mg | CHEWABLE_TABLET | Freq: Once | ORAL | Status: AC
Start: 2021-03-08 — End: 2021-03-08
  Administered 2021-03-08: 324 mg via ORAL
  Filled 2021-03-08: qty 4

## 2021-03-08 MED ORDER — IBUPROFEN 800 MG PO TABS: 800.0000 mg | ORAL_TABLET | Freq: Three times a day (TID) | ORAL | 0 refills | Status: DC | PRN

## 2021-03-08 NOTE — ED Triage Notes (Signed)
Reports chest pain that started last night but got worse this am.  Patient complains of sob.  Took 81mg  asa at home.  Denies n/v/ or radiation of pain.  ?

## 2021-03-08 NOTE — ED Provider Notes (Signed)
[facility] EMERGENCY DEPARTMENT Provider Note   CSN: [id number] Arrival date & time: 03/08/21  1219     History  Chief Complaint  Patient presents with   Chest Pain    Jason Anthony is a 76 y.o. male.   Chest Pain   Pt is a 76 y/o male - with hx of Htn and cholesterol - has had a valve replaced in the past, in fact this patient has a coronary history based on a heart catheterization from 4 years ago in 2019.  There was no disease in the left main, the LAD was large caliber there was moderate stenosis that was not flow-limiting there was focal stenosis in the distal LAD which was too small for stenting.  Circumflex had moderate proximal stenosis without need for intervention, the RCA had mild disease, moderate disease in the posterior descending artery.  There was severe aortic stenosis.  He reports to me that last night while he was in bed he developed some chest pain on the left side of his chest, it is worse with taking a deep breath and seems to radiate to the back.  Seems to be rather persistent over the last 24 hours  He denies any leg swelling, denies being on any anticoagulants, denies a hypercoagulable state and no history of blood clots that he is aware of.  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 Authorizing Provider  amLODipine (NORVASC) 10 MG tablet Take 1 tablet (10 mg total) by mouth daily. 08/16/20 11/14/20  Dunn, Tacey Ruiz, PA-C  amoxicillin (AMOXIL) 500 MG tablet TAKE 4 TABLETS ONE HOUR BEFORE ANY DENTAL PROCEDURE. 01/13/20   Laurann Montana, PA-C  aspirin EC 81 MG tablet Take 81 mg by mouth daily.    [provider]  atorvastatin (LIPITOR) 10 MG tablet Take 10 mg by mouth daily.    [provider]  clonazePAM (KLONOPIN) 0.5 MG tablet Take 0.5 mg by mouth 2 (two) times daily as needed for anxiety.    [provider]  fluticasone Aleda Grana) 50 MCG/ACT nasal  spray  12/15/18   [provider]  losartan (COZAAR) 25 MG tablet Take 1 tablet (25 mg total) by mouth daily. Patient taking differently: Take 12.5 mg by mouth daily. 08/16/20 11/14/20  Dunn, Tacey Ruiz, PA-C  sertraline (ZOLOFT) 50 MG tablet Take by mouth daily.    [provider]  traZODone (DESYREL) 50 MG tablet Take 100 mg by mouth at bedtime.     [provider]  ursodiol (ACTIGALL) 250 MG tablet Take 750 mg by mouth 2 (two) times daily.    [provider]      Allergies    Patient has no known allergies.    Review of Systems   Review of Systems  Cardiovascular:  Positive for chest pain.  All other systems reviewed and are negative.  Physical Exam Updated Vital Signs BP (!) 160/72 (BP Location: Right Arm)    Pulse 88    Temp 99.6 F (37.6 C) (Oral)    Resp (!) 22    Ht 1.88 m (6\' 2" )    Wt 97.5 kg    SpO2 97%    BMI 27.60 kg/m  Physical Exam Vitals and nursing note reviewed.  Constitutional:      General: He is not in acute distress.    Appearance: He is well-developed.  HENT:     Head: Normocephalic  and atraumatic.     Mouth/Throat:     Pharynx: No oropharyngeal exudate.  Eyes:     General: No scleral icterus.       Right eye: No discharge.        Left eye: No discharge.     Conjunctiva/sclera: Conjunctivae normal.     Pupils: Pupils are equal, round, and reactive to light.  Neck:     Thyroid: No thyromegaly.     Vascular: No JVD.  Cardiovascular:     Rate and Rhythm: Normal rate and regular rhythm.     Heart sounds: Normal heart sounds. No murmur heard.   No friction rub. No gallop.  Pulmonary:     Effort: Pulmonary effort is normal. No respiratory distress.     Breath sounds: Normal breath sounds. No wheezing or rales.  Abdominal:     General: Bowel sounds are normal. There is no distension.     Palpations: Abdomen is soft. There is no mass.     Tenderness: There is no abdominal tenderness.  Musculoskeletal:        General: No  tenderness. Normal range of motion.     Cervical back: Normal range of motion and neck supple.     Right lower leg: No edema.     Left lower leg: No edema.  Lymphadenopathy:     Cervical: No cervical adenopathy.  Skin:    General: Skin is warm and dry.     Findings: No erythema or rash.  Neurological:     Mental Status: He is alert.     Coordination: Coordination normal.  Psychiatric:        Behavior: Behavior normal.    ED Results / Procedures / Treatments   Labs (all labs ordered are listed, but only abnormal results are displayed) Labs Reviewed  BASIC METABOLIC PANEL  CBC  BRAIN NATRIURETIC PEPTIDE  TROPONIN I (HIGH SENSITIVITY)    EKG EKG Interpretation  Date/Time:  Thursday March 08 2021 12:27:48 EST Ventricular Rate:  88 PR Interval:  192 QRS Duration: 122 QT Interval:  356 QTC Calculation: 430 R Axis:   -52 Text Interpretation: Normal sinus rhythm Left anterior fascicular block Left ventricular hypertrophy with QRS widening and repolarization abnormality ( R in aVL , Cornell product ) Abnormal ECG When compared with ECG of 04-Jun-2017 07:43, Nonspecific T wave abnormality no longer evident in Inferior leads Confirmed by Eber Hong ([id number]) on 03/08/2021 1:07:31 PM  Radiology DG Chest 2 View  Result Date: 03/08/2021 CLINICAL DATA:  Chest pain. EXAM: CHEST - 2 VIEW COMPARISON:  05/26/2017 FINDINGS: Two views of the chest demonstrate low lung volumes. Bilateral patchy lung densities. Difficult to exclude acute on chronic disease. Heart size is within normal limits and stable. Trachea is midline. No large pleural effusions. Patient had a TAVR procedure. IMPRESSION: 1. Low lung volumes with patchy parenchymal lung densities bilaterally. Lung densities could represent atelectasis, chronic changes or even mild edema. Electronically Signed   By: Richarda Overlie M.D.   On: 03/08/2021 12:56    Procedures Procedures    Medications Ordered in ED Medications - No data to  display  ED Course/ Medical Decision Making/ A&P                           Medical Decision Making Amount and/or Complexity of Data Reviewed Labs: ordered. Radiology: ordered.  Risk OTC drugs. Prescription drug management.   This patient presents to the ED for  concern of chest discomfort, this involves an extensive number of treatment options, and is a complaint that carries with it a high risk of complications and morbidity.  The differential diagnosis includes acute coronary syndrome, pulmonary embolism, pneumothorax, pneumonia   Co morbidities that complicate the patient evaluation  Prior valve replacement, hypertension, hypercholesterolemia.  The patient does not smoke.  He has known prior coronary disease, this thus far has been managed medically   Additional history obtained:  Additional history obtained from electronic medical record External records from outside source obtained and reviewed including prior heart catheterization reports from about 4 years ago, see history   Lab Tests:  I Ordered, and personally interpreted labs.  The pertinent results include: D-dimer is elevated, troponin is normal, metabolic panel shows a creatinine of 1.7, CBC shows no leukocytosis only minimal anemia.  BNP is 123.   Imaging Studies ordered:  I ordered imaging studies including chest x-ray overall unremarkable, CT angiogram pending at the time of change of shift I independently visualized and interpreted imaging which showed no acute findings on x-ray I agree with the radiologist interpretation   Cardiac Monitoring:  The patient was maintained on a cardiac monitor.  I personally viewed and interpreted the cardiac monitored which showed an underlying rhythm of: Sinus rhythm   Medicines ordered and prescription drug management:  I ordered medication including aspirin and nitroglycerin for chest pain Reevaluation of the patient after these medicines showed that the patient  improved I have reviewed the patients home medicines and have made adjustments as needed   Test Considered:  CT angiogram, ultimately this needed to be performed but is pending at the time of change of shift   Problem List / ED Course:  Chest pain, will need CT angiogram, at change of shift care signed out to Dr. Estell Harpin, I suspect that if the angiogram is negative for pulmonary embolism and the second troponin is negative this patient can likely be discharged home as this is a very atypical sounding pain.  Doubt acute coronary syndrome if that is the case    Social Determinants of Health:  None           Final Clinical Impression(s) / ED Diagnoses Final diagnoses:  None    Rx / DC Orders ED Discharge Orders     None         Eber Hong, MD 03/08/21 1537

## 2021-03-08 NOTE — Telephone Encounter (Signed)
Pt c/o of Chest Pain: STAT if CP now or developed within 24 hours ? ?1. Are you having CP right now? yes ? ?2. Are you experiencing any other symptoms (ex. SOB, nausea, vomiting, sweating)?  ? ?3. How long have you been experiencing CP?  Started last night ? ?4. Is your CP continuous or coming and going? stays ? ? ?5. Have you taken Nitroglycerin?  no ?? ? ?

## 2021-03-08 NOTE — Telephone Encounter (Signed)
Spoke with the patient who reports that he is currently having chest pain. Pain began last night and has worsened throughout the morning. Describes the pain as throbbing/aching in the center of his chest. He has not had this feeling previously. He states he does have shortness of breath. No other symptoms. He has not taken anything to try and relive the pain. Advised patient with current symptoms of chest pain that he should go to the ER. Patient agreeable.  ?

## 2021-03-08 NOTE — Discharge Instructions (Signed)
Follow-up with your family doctor within a week for recheck

## 2021-03-08 NOTE — ED Notes (Signed)
Talked with patient about taking nitro for relief of chest pain.  Patient states that he would not like to take at this time.  ?

## 2021-03-12 ENCOUNTER — Telehealth: Payer: Self-pay | Admitting: Cardiovascular Disease

## 2021-03-12 NOTE — Telephone Encounter (Signed)
Patient would like for Dr. Eden Emms to look at notes and results from his recent visit to the ER. ?Would like to know if Dr. Eden Emms needs to see him sooner. ?

## 2021-03-12 NOTE — Telephone Encounter (Signed)
Patient would like for Dr. Johnsie Cancel to look at the test results from his visit to [facility] 03/08/21. He is not scheduled for a follow-up until June and would like Dr. Kyla Balzarine recommendations ?

## 2021-03-12 NOTE — Telephone Encounter (Signed)
Advised patient to keep appointment with Dr. Eden Emms in June. Patient verbalized understanding.  ?

## 2021-03-16 ENCOUNTER — Telehealth: Payer: Self-pay | Admitting: Cardiovascular Disease

## 2021-03-16 NOTE — Telephone Encounter (Signed)
Spoke with Toma Copier at [facility], office of Manson Passey, NP, pt's PCP office.  Bethany reports pt was seen in the office this am due to not feeling well and reports pt tachy with HR's of 102 on arrival, 185 after walking and 105 at rest in the office.  She states office completed orthos on pt with readings as follow 126/64 HR-92, lying ?118/74 - HR 102, sitting and 128/66 HR-110, standing.  O2 Sat 99 on arrival with ranges of 93-96 while walking.  The office did not complete and EKG today.  PCP would like for pt to be scheduled with Dr Eden Emms as soon as possible for further evaluation of tachycardia.  Pt was advised by PCP to go to ED if symptoms worsen. ?Will forward to Dr Fabio Bering RN for assistance in contacting pt and scheduling appointment. ?

## 2021-03-16 NOTE — Telephone Encounter (Signed)
STAT if HR is under 50 or over 120 ?(normal HR is 60-100 beats per minute) ? ?What is your heart rate? 185 ? ?Do you have a log of your heart rate readings (document readings)?   ? ?Do you have any other symptoms?   ? ?

## 2021-03-16 NOTE — Telephone Encounter (Signed)
Called patient made him an appointment with Nell Range PA next week since he is a TAVR patient. Will send a message to Dr. Angelena Form and Dr. Johnsie Cancel so they are aware of patient's symptoms. ?

## 2021-03-18 NOTE — Progress Notes (Unsigned)
Cardiology Office Note:    Date:  03/18/2021   ID:  Jason Anthony, DOB [birth year], MRN [id number]  PCP:  Frederic Jericho, NP   [facility] Providers Cardiologist:  Jenkins Rouge, MD { Click to update primary MD,subspecialty MD or APP then REFRESH:1}    Referring MD: Frederic Jericho, NP   Tachycardia   History of Present Illness:    Jason Anthony is a 76 y.o. 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    Last 2D echo 06/2019 showed EF 65-70%, grade 2 DD, severe LAE, s/p TAVR with mild perivalvular leak, mild dilation of aortic root and ascending aorta - when compared to study of 09/2018, the peak and mean transaortic gradients had decreased from 34/38mmHg to 20/53mmHg and visually perivalvular AI appeared the same. Regarding his sarcoidosis, he has a long history of this with scarring of the lung by prior imaging and has not wanted to follow up with pulmonology. He saw them very remotely per his report in [place]. He also has a history of thrombocytopenia hence why he is no longer on Plavix.   Last echo 09/07/20 showed EF 65-70%, G2DD, s/p TAVR with a 29 mm Edwards S3 with a mean gradient of 12 mm hg and moderate perivalvular AI by pressure halftime noted at 1pm in the PSAX view.   He was seen in the ER 03/08/21 for atypical chest pain. He ruled out for MI by enzymes and ECG. CTA negative.   He was seen by his PCP on 03/16/21 and HR's of 102 on arrival, 185 after walking and 105 at rest in the office. Orthostatic vitals reported: 126/64 HR-92 (lying), 118/74 HR 102 (sitting) and 128/66 HR-110 (standing.)  Today the patient presents to clinic for follow up.     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   29 mm Edwards Sapien 3 transcatheter heart valve placed via percutaneous right transfemoral approach    Sarcoidosis    Severe aortic stenosis    s/p TAVR 05/2017   Thrombocytopenia ([place])     Past Surgical History:  Procedure Laterality Date   CARDIAC CATHETERIZATION  04/30/2017   COLONOSCOPY     HAMMER TOE SURGERY     "?toe; ?side"   INGUINAL HERNIA REPAIR Right    INTRAOPERATIVE TRANSTHORACIC ECHOCARDIOGRAM N/A 06/03/2017   Procedure: INTRAOPERATIVE TRANSTHORACIC ECHOCARDIOGRAM;  Surgeon: Burnell Blanks, MD;  Location: [place];  Service: Open Heart Surgery;  Laterality: N/A;   LAPAROSCOPIC CHOLECYSTECTOMY     RIGHT/LEFT HEART CATH AND CORONARY ANGIOGRAPHY N/A 04/30/2017   Procedure: RIGHT/LEFT HEART CATH AND CORONARY ANGIOGRAPHY;  Surgeon: Burnell Blanks, MD;  Location: [place] [facility];  Service: Cardiovascular;  Laterality: N/A;   SALIVARY STONE REMOVAL  1961   "spit gland had a stone on it"   TRANSCATHETER AORTIC VALVE REPLACEMENT, TRANSFEMORAL N/A 06/03/2017   Procedure: TRANSCATHETER AORTIC VALVE REPLACEMENT, TRANSFEMORAL;  Surgeon: Burnell Blanks, MD;  [facility];  Service: Open Heart Surgery;  Laterality: N/A;    Current Medications: No outpatient medications have been marked as taking for the 03/22/21 encounter (Appointment) with Eileen Stanford, PA-C.     Allergies:   Patient has no known allergies.   Social History   Socioeconomic History   Marital status: Divorced    Spouse name: Not on file   Number of children: Not on file   Years of education: Not on file   Highest education level: Not on file  Occupational History   Occupation: [id number]  Tobacco Use    Smoking status: Never   Smokeless tobacco: Never  Vaping Use   Vaping Use: Never used  Substance and Sexual Activity   Alcohol use: No   Drug use: Never   Sexual activity: Not Currently  Other Topics Concern   Not on file  Social History Narrative   Not on file   Social Determinants of Health   Financial Resource Strain: Not on file  Food Insecurity: Not on file  Transportation Needs: Not on file  Physical Activity: Not on file  Stress: Not on file  Social Connections: Not on file     Family History: The patient's family history includes Breast cancer in his sister; CVA (age of onset: 74) in his father; Hypertension in his mother.  ROS:   Please see the history of present illness.     All other systems reviewed and are negative.  EKGs/Labs/Other Studies Reviewed:    The following studies were reviewed today:   2D echo 07/01/19    1. Left ventricular ejection fraction, by estimation, is 65 to 70%. The  left ventricle has normal function. The left ventricle has no regional  wall motion abnormalities. Left ventricular diastolic parameters are  consistent with Grade II diastolic  dysfunction (pseudonormalization). Elevated left ventricular end-diastolic  pressure.   2. Right ventricular systolic function is normal. The right ventricular  size is normal. There is normal pulmonary artery systolic pressure.   3. Left atrial size was severely dilated.   4. The mitral valve is normal in structure. Trivial mitral valve  regurgitation. No evidence of mitral stenosis.   5. There is a 29 mm Edwards Sapien prosthetic, stented (TAVR) valve  present in the aortic posiion. Echo findings are consistent with normal  structure and function of the aortic valve prosthesis. Mild perivalvular  leak seen in PSSA view at 1 o'clock.  Visually this appears mild but by PHT is moderate at 357msec. No aortic  stenosis is present. Aortic valve mean gradient measures 11.0 mmHg. Aortic  valve peak  gradient measures 20.2 mmHg. Aortic valve area, by VTI measures  3.22 cm. DI 0.61.   6. Aortic dilatation noted. There is mild dilatation of the aortic root  and of the ascending aorta measuring 37 mm and 19mm repsectively.   7. The inferior vena cava is normal in size with greater than 50%  respiratory variability, suggesting right atrial pressure of 3 mmHg.   8. WHen compared to study of 09/2018, the peak and mean transaortic  gradients have decreased from 34/62mmHg to 20/31mmHg and visually  perivalvular AI appears the same.   [facility] 04/2017 Ost RCA to Prox RCA lesion is 30% stenosed. Prox RCA to Mid RCA lesion is 20% stenosed. Ost RPDA lesion is 30% stenosed. Prox Cx lesion is 60% stenosed. Dist LAD lesion is 80% stenosed. Prox LAD lesion is 50% stenosed. Prox LAD to Mid LAD lesion is 30%  stenosed. Ost 1st Diag lesion is 60% stenosed. There is severe aortic valve stenosis. Ost 2nd Mrg lesion is 30% stenosed. Mid Cx lesion is 30% stenosed.   1. The Left main has no obstructive disease 2. The LAD is a large caliber vessel in the proximal and mid vessel and then becomes smaller in caliber in the distal vessel. The mid LAD has a moderate stenosis that does not appear to be flow limiting. The distal LAD is smaller in caliber. There is a focal severe stenosis in the distal LAD. This vessel appears to be too small for stenting. 3. The Circumflex is a large caliber vessel with a moderate proximal stenosis just before the takeoff of a large obtuse marginal branch. The large caliber first obtuse marginal branch is free of obstructive disease. 4. The RCA is a large, dominant vessel with mild ostial stenosis, mild disease in the mid and distal vessel. There is mild disease in the moderate caliber PDA. 5. Severe aortic stenosis (mean gradient 42.1 mmHg, peak to peak gradient 49 mmHg, AVA 1.2 cm2).   Recommendations: I think his CAD can be managed at this time with medical therapy. He is having no  angina. The LAD lesion is distal and the vessel is small in caliber (not favorable for stenting) and this only supplies the apical segment of myocardium. The Circumflex lesion is moderate. Will proceed with workup for AVR vs TAVR. He has an appointment with Dr. Roxy Manns next week. I suspect that he will be a good TAVR candidate.      EKG:  EKG is ordered today.  The ekg ordered today demonstrates ***  Recent Labs: 03/08/2021: B Natriuretic Peptide 123.0; BUN 23; Creatinine, Ser 1.70; Hemoglobin 12.6; Platelets 99; Potassium 4.3; Sodium 139  Recent Lipid Panel No results found for: CHOL, TRIG, HDL, CHOLHDL, VLDL, LDLCALC, LDLDIRECT   Risk Assessment/Calculations:           Physical Exam:    VS:  There were no vitals taken for this visit.    Wt Readings from Last 3 Encounters:  03/08/21 215 lb (97.5 kg)  08/15/20 205 lb 12.8 oz (93.4 kg)  01/13/20 209 lb 12.8 oz (95.2 kg)     GEN: *** Well nourished, well developed in no acute distress HEENT: Normal NECK: No JVD; No carotid bruits LYMPHATICS: No lymphadenopathy CARDIAC: ***RRR, no murmurs, rubs, gallops RESPIRATORY:  Clear to auscultation without rales, wheezing or rhonchi  ABDOMEN: Soft, non-tender, non-distended MUSCULOSKELETAL:  No edema; No deformity  SKIN: Warm and dry NEUROLOGIC:  Alert and oriented x 3 PSYCHIATRIC:  Normal affect   ASSESSMENT:    No diagnosis found. PLAN:    In order of problems listed above:  Tachycardia:   Severe AS s/p TAVR: Last echo 09/07/20 showed EF 65-70%, G2DD, s/p TAVR with a 29 mm Edwards S3 with a mean gradient of 12 mm hg and moderate perivalvular AI by pressure halftime noted at 1pm in the PSAX view.    CAD:   Continue low dose ASA as tolerated and statin.   Essential HTN:    Hyperlipidemia:       {Are you ordering a CV Procedure (e.g. stress test, cath, DCCV, TEE, etc)?   Press F2        :[id number]    Medication Adjustments/Labs and Tests Ordered: Current medicines are  reviewed at length with the patient today.  Concerns regarding medicines are outlined above.  No orders of the defined types were placed in this encounter.  No orders of the defined types were placed in this encounter.   There are no Patient Instructions on file for this visit.   Signed, Angelena Form, PA-C  03/18/2021 2:53 PM    [facility]

## 2021-03-22 ENCOUNTER — Other Ambulatory Visit: Payer: Self-pay

## 2021-03-22 ENCOUNTER — Encounter: Payer: Self-pay | Admitting: Physician Assistant

## 2021-03-22 ENCOUNTER — Encounter: Payer: Self-pay | Admitting: *Deleted

## 2021-03-22 ENCOUNTER — Ambulatory Visit: Payer: Medicare PPO | Admitting: Physician Assistant

## 2021-03-22 VITALS — BP 126/68 | HR 81 | Ht 74.0 in | Wt 213.0 lb

## 2021-03-22 DIAGNOSIS — R000 Tachycardia, unspecified: Secondary | ICD-10-CM | POA: Diagnosis not present

## 2021-03-22 DIAGNOSIS — I10 Essential (primary) hypertension: Secondary | ICD-10-CM

## 2021-03-22 DIAGNOSIS — D869 Sarcoidosis, unspecified: Secondary | ICD-10-CM

## 2021-03-22 DIAGNOSIS — I2510 Atherosclerotic heart disease of native coronary artery without angina pectoris: Secondary | ICD-10-CM

## 2021-03-22 DIAGNOSIS — Z952 Presence of prosthetic heart valve: Secondary | ICD-10-CM | POA: Diagnosis not present

## 2021-03-22 DIAGNOSIS — E785 Hyperlipidemia, unspecified: Secondary | ICD-10-CM

## 2021-03-22 NOTE — Patient Instructions (Addendum)
?  Medication Instructions:  ? ?Your physician recommends that you continue on your current medications as directed. Please refer to the Current Medication list given to you today. ? ? ?*If you need a refill on your cardiac medications before your next appointment, please call your pharmacy* ? ? ?Lab Work: [place] ? ? ?If you have labs (blood work) drawn today and your tests are completely normal, you will receive your results only by: ?MyChart Message (if you have MyChart) OR ?A paper copy in the mail ?If you have any lab test that is abnormal or we need to change your treatment, we will call you to review the results. ? ? ?Testing/Procedures:  Your physician has requested that you have an echocardiogram. Echocardiography is a painless test that uses sound waves to create images of your heart. It provides your doctor with information about the size and shape of your heart and how well your heart?s chambers and valves are working. This procedure takes approximately one hour. There are no restrictions for this procedure. ? ?Your physician has requested that you have a lexiscan myoview. For further information please visit [email or web address]. Please follow instruction sheet, as given.BOTH  TEST ON SAME DAY IF POSSIBLE ? ? ?Follow-Up: ?At [facility], you and your health needs are our priority.  As part of our continuing mission to provide you with exceptional heart care, we have created designated Provider Care Teams.  These Care Teams include your primary Cardiologist (physician) and Advanced Practice Providers (APPs -  Physician Assistants and Nurse Practitioners) who all work together to provide you with the care you need, when you need it. ? ?We recommend signing up for the patient portal called "MyChart".  Sign up information is provided on this After Visit Summary.  MyChart is used to connect with patients for Virtual Visits (Telemedicine).  Patients are able to view lab/test results, encounter  notes, upcoming appointments, etc.  Non-urgent messages can be sent to your provider as well.   ?To learn more about what you can do with MyChart, go to [email or web address].   ? ?Your next appointment:   AS SCHEDULED  ? ?The format for your next appointment:   ?In Person ? ?Provider:   ?Jenkins Rouge, MD   ? ? ?Other Instructions ? ?

## 2021-04-09 ENCOUNTER — Ambulatory Visit (INDEPENDENT_AMBULATORY_CARE_PROVIDER_SITE_OTHER): Payer: Medicare PPO | Admitting: Pulmonary Disease

## 2021-04-09 ENCOUNTER — Encounter: Payer: Self-pay | Admitting: Pulmonary Disease

## 2021-04-09 VITALS — BP 134/68 | HR 80 | Temp 97.9°F | Ht 74.0 in | Wt 203.6 lb

## 2021-04-09 DIAGNOSIS — J849 Interstitial pulmonary disease, unspecified: Secondary | ICD-10-CM | POA: Diagnosis not present

## 2021-04-09 DIAGNOSIS — J984 Other disorders of lung: Secondary | ICD-10-CM

## 2021-04-09 NOTE — Progress Notes (Signed)
? ?Synopsis: Referred in April 2023 for abnormal CT chest by Frederic Jericho, NP ? ?Subjective:  ? ?PATIENT ID: Jason Anthony GENDER: male DOB: 1945-02-12, MRN: [id number] ? ?No chief complaint on file. ? ? ?This is a 76 year old gentleman, past medical history of coronary disease, CKD, hyperlipidemia, hypertension, aortic stenosis status post TAVR.He was told the past that he had possible sarcoidosis.  Does not remember having a biopsy to confirm this.  He had abnormal CT imaging that was complete on his CT chest. Patient had a CT chest that was complete in the emergency department at [place] bilateral peripheral infiltrates consistent with interstitial lung disease.  Patient has been told in the past that he has abnormal CT imaging and chest x-rays.  He only has shortness of breath with exertion.  He had pulmonary function test completed in 2019 which revealed severe restrictive defect and reduced DLCO.  He was referred here for evaluation.  Occupational history he worked in [employer] for several years in which he retired from.  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  He said that he would go home daily with a significant amount of fibers and powder within his upper respiratory tract.  He figured that grinding this metal for years would have led to the abnormalities that he is experiencing. ? ? ?Past Medical History:  ?Diagnosis Date  ? CAD (coronary artery disease)   ? multivessel, primarily nonobstructive managed medically 2019  ? Chronic lower back pain   ? CKD (chronic kidney disease), stage III ([place])   ? DDD (degenerative disc disease), lumbar   ? Essential tremor   ? Hyperlipidemia   ? Hypertension   ? 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 29 mm Edwards Sapien 3 transcatheter heart valve placed via percutaneous right transfemoral approach   ? Sarcoidosis   ? Severe aortic stenosis   ? s/p  TAVR 05/2017  ? Thrombocytopenia ([place])   ?  ? ?Family History  ?Problem Relation Age of Onset  ? Hypertension Mother   ? CVA Father 57  ? Breast cancer Sister   ?  ? ?Past Surgical History:  ?Procedure Laterality Date  ? CARDIAC CATHETERIZATION  04/30/2017  ? COLONOSCOPY    ? HAMMER TOE SURGERY    ? "?toe; ?side"  ? INGUINAL HERNIA REPAIR Right   ? INTRAOPERATIVE TRANSTHORACIC ECHOCARDIOGRAM N/A 06/03/2017  ? Procedure: INTRAOPERATIVE TRANSTHORACIC ECHOCARDIOGRAM;  Surgeon: Burnell Blanks, MD;  Location: [place];  Service: Open Heart Surgery;  Laterality: N/A;  ? LAPAROSCOPIC CHOLECYSTECTOMY    ? RIGHT/LEFT HEART CATH AND CORONARY ANGIOGRAPHY N/A 04/30/2017  ? Procedure: RIGHT/LEFT HEART CATH AND CORONARY ANGIOGRAPHY;  Surgeon: Burnell Blanks, MD;  Location: [place] [facility];  Service: Cardiovascular;  Laterality: N/A;  ? [place]  ? "spit gland had a stone on it"  ? TRANSCATHETER AORTIC VALVE REPLACEMENT, TRANSFEMORAL N/A 06/03/2017  ? Procedure: TRANSCATHETER AORTIC VALVE REPLACEMENT, TRANSFEMORAL;  Surgeon: Burnell Blanks, MD;  Location: [place];  Service: Open Heart Surgery;  Laterality: N/A;  ? ? ?Social History  ? ?Socioeconomic History  ? Marital status: Divorced  ?  Spouse name: Not on file  ? Number of children: Not on file  ? Years of education: Not on file  ? Highest education level: Not on file  ?Occupational History  ? Occupation: [employer]  ?Tobacco Use  ? Smoking  status: Never  ? Smokeless tobacco: Never  ?Vaping Use  ? Vaping Use: Never used  ?Substance and Sexual Activity  ? Alcohol use: No  ? Drug use: Never  ? Sexual activity: Not Currently  ?Other Topics Concern  ? Not on file  ?Social History Narrative  ? Not on file  ? ?Social Determinants of Health  ? ?Financial Resource Strain: Not on file  ?Food Insecurity: Not on file  ?Transportation Needs: Not on file  ?Physical Activity: Not on file  ?Stress: Not on file  ?Social Connections: Not on file  ?Intimate  Partner Violence: Not on file  ?  ? ?No Known Allergies  ? ?Outpatient Medications Prior to Visit  ?Medication Sig Dispense Refill  ? amoxicillin (AMOXIL) 500 MG tablet TAKE 4 TABLETS ONE HOUR BEFORE ANY DENTAL PROCEDURE. 4 tablet 2  ? aspirin EC 81 MG tablet Take 81 mg by mouth daily.    ? atorvastatin (LIPITOR) 10 MG tablet Take 10 mg by mouth daily.    ? clonazePAM (KLONOPIN) 0.5 MG tablet Take 0.5 mg by mouth 2 (two) times daily as needed for anxiety.    ? fluticasone (FLONASE) 50 MCG/ACT nasal spray Place 1 spray into both nostrils daily as needed for allergies.    ? ibuprofen (ADVIL) 800 MG tablet Take 1 tablet (800 mg total) by mouth every 8 (eight) hours as needed for moderate pain. 21 tablet 0  ? traZODone (DESYREL) 50 MG tablet Take 50 mg by mouth at bedtime.    ? ursodiol (ACTIGALL) 250 MG tablet Take 750 mg by mouth 2 (two) times daily.    ? amLODipine (NORVASC) 10 MG tablet Take 1 tablet (10 mg total) by mouth daily. 180 tablet 3  ? losartan (COZAAR) 25 MG tablet Take 1 tablet (25 mg total) by mouth daily. (Patient taking differently: Take 12.5 mg by mouth daily.) 90 tablet 3  ? ?No facility-administered medications prior to visit.  ? ? ?Review of Systems  ?Constitutional:  Negative for chills, fever, malaise/fatigue and weight loss.  ?HENT:  Negative for hearing loss, sore throat and tinnitus.   ?Eyes:  Negative for blurred vision and double vision.  ?Respiratory:  Positive for cough and shortness of breath. Negative for hemoptysis, sputum production, wheezing and stridor.   ?Cardiovascular:  Negative for chest pain, palpitations, orthopnea, leg swelling and PND.  ?Gastrointestinal:  Negative for abdominal pain, constipation, diarrhea, heartburn, nausea and vomiting.  ?Genitourinary:  Negative for dysuria, hematuria and urgency.  ?Musculoskeletal:  Negative for joint pain and myalgias.  ?Skin:  Negative for itching and rash.  ?Neurological:  Negative for dizziness, tingling, weakness and headaches.   ?Endo/Heme/Allergies:  Negative for environmental allergies. Does not bruise/bleed easily.  ?Psychiatric/Behavioral:  Negative for depression. The patient is not nervous/anxious and does not have insomnia.   ?All other systems reviewed and are negative. ? ? ?Objective:  ?Physical Exam ?Vitals reviewed.  ?Constitutional:   ?   General: He is not in acute distress. ?   Appearance: He is well-developed.  ?   Comments: Tall, thin, elderly   ?HENT:  ?   Head: Normocephalic and atraumatic.  ?Eyes:  ?   General: No scleral icterus. ?   Conjunctiva/sclera: Conjunctivae normal.  ?   Pupils: Pupils are equal, round, and reactive to light.  ?Neck:  ?   Vascular: No JVD.  ?   Trachea: No tracheal deviation.  ?Cardiovascular:  ?   Rate and Rhythm: Normal rate and regular rhythm.  ?  Heart sounds: Normal heart sounds. No murmur heard. ?Pulmonary:  ?   Effort: Pulmonary effort is normal. No tachypnea, accessory muscle usage or respiratory distress.  ?   Breath sounds: Normal breath sounds. No stridor. No wheezing, rhonchi or rales.  ?Abdominal:  ?   General: Bowel sounds are normal. There is no distension.  ?   Palpations: Abdomen is soft.  ?   Tenderness: There is no abdominal tenderness.  ?Musculoskeletal:     ?   General: No tenderness.  ?   Cervical back: Neck supple.  ?Lymphadenopathy:  ?   Cervical: No cervical adenopathy.  ?Skin: ?   General: Skin is warm and dry.  ?   Capillary Refill: Capillary refill takes less than 2 seconds.  ?   Findings: No rash.  ?Neurological:  ?   Mental Status: He is alert and oriented to person, place, and time.  ?Psychiatric:     ?   Behavior: Behavior normal.  ? ? ? ?Vitals:  ? 04/09/21 1426  ?BP: 134/68  ?Pulse: 80  ?Temp: 97.9 ?F (36.6 ?C)  ?TempSrc: Oral  ?SpO2: 94%  ?Weight: 203 lb 9.6 oz (92.4 kg)  ?Height: 6\' 2"  (1.88 m)  ? ?94% on RA ?BMI Readings from Last 3 Encounters:  ?04/09/21 26.14 kg/m?  ?03/22/21 27.35 kg/m?  ?03/08/21 27.60 kg/m?  ? ?Wt Readings from Last 3 Encounters:   ?04/09/21 203 lb 9.6 oz (92.4 kg)  ?03/22/21 213 lb (96.6 kg)  ?03/08/21 215 lb (97.5 kg)  ? ? ? ?CBC ?   ?Component Value Date/Time  ? WBC 9.9 03/08/2021 1246  ? RBC 3.83 (L) 03/08/2021 1246  ? HGB 12.6 (L)

## 2021-04-09 NOTE — Patient Instructions (Signed)
Thank you for visiting Dr. Tonia Brooms at [facility]. ?Today we recommend the following: ? ?Orders Placed This Encounter  ?Procedures  ? CT CHEST HIGH RESOLUTION  ? Pulmonary Function Test  ? ?Return in about 3 months (around 07/09/2021). ? ? ? ?Please do your part to reduce the spread of COVID-19.  ? ?

## 2021-04-11 ENCOUNTER — Telehealth (HOSPITAL_COMMUNITY): Payer: Self-pay | Admitting: *Deleted

## 2021-04-11 NOTE — Telephone Encounter (Signed)
Patient given detailed instructions per Myocardial Perfusion Study Information Sheet for the test on 04/17/21 Patient notified to arrive 15 minutes early and that it is imperative to arrive on time for appointment to keep from having the test rescheduled. ? If you need to cancel or reschedule your appointment, please call the office within 24 hours of your appointment. . Patient verbalized understanding.  Jason Anthony ? ? ? ?

## 2021-04-17 ENCOUNTER — Ambulatory Visit (HOSPITAL_COMMUNITY): Payer: Medicare PPO | Attending: Cardiology

## 2021-04-17 ENCOUNTER — Ambulatory Visit (HOSPITAL_BASED_OUTPATIENT_CLINIC_OR_DEPARTMENT_OTHER): Payer: Medicare PPO

## 2021-04-17 DIAGNOSIS — R0602 Shortness of breath: Secondary | ICD-10-CM

## 2021-04-17 LAB — ECHOCARDIOGRAM COMPLETE
AR max vel: 1.88 cm2
AV Area VTI: 2.05 cm2
AV Area mean vel: 2.01 cm2
AV Mean grad: 11 mmHg
AV Peak grad: 21.5 mmHg
Ao pk vel: 2.32 m/s
Area-P 1/2: 2.67 cm2
Height: 74 in
P 1/2 time: 303 msec
S' Lateral: 3.7 cm
Weight: 3408 oz

## 2021-04-17 LAB — MYOCARDIAL PERFUSION IMAGING
LV dias vol: 129 mL (ref 62–150)
LV sys vol: 52 mL
Nuc Stress EF: 60 %
Peak HR: 81 {beats}/min
Rest HR: 74 {beats}/min
Rest Nuclear Isotope Dose: 10.4 mCi
SDS: 1
SRS: 0
SSS: 1
ST Depression (mm): 0 mm
Stress Nuclear Isotope Dose: 30.9 mCi
TID: 1.12

## 2021-04-17 MED ORDER — TECHNETIUM TC 99M TETROFOSMIN IV KIT
30.9000 | PACK | Freq: Once | INTRAVENOUS | Status: AC | PRN
  Administered 2021-04-17: 30.9 via INTRAVENOUS
  Filled 2021-04-17: qty 31

## 2021-04-17 MED ORDER — REGADENOSON 0.4 MG/5ML IV SOLN
0.4000 mg | Freq: Once | INTRAVENOUS | Status: AC
  Administered 2021-04-17: 0.4 mg via INTRAVENOUS

## 2021-04-17 MED ORDER — TECHNETIUM TC 99M TETROFOSMIN IV KIT
10.4000 | PACK | Freq: Once | INTRAVENOUS | Status: AC | PRN
  Administered 2021-04-17: 10.4 via INTRAVENOUS
  Filled 2021-04-17: qty 11

## 2021-06-18 NOTE — Progress Notes (Signed)
Cardiology Office Note:    Date:  06/21/2021   ID:  Jason Anthony, DOB 06-Dec-1945, MRN [id number]  PCP:  Frederic Jericho, NP   [facility] Providers Cardiologist:  Jenkins Rouge, MD     Referring MD: Joseph Art, MD   Tachycardia, SOB  History of Present Illness:    Jason Anthony is a 76 y.o. 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 Post operative echo showed EF 65% and a normally functioning TAVR valve with trivial PVL and mildly elevated transvalvular mean gradient of 15 mm Hg. He was discharged on ASA and Plavix.   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 He ruled out for MI by enzymes and ECG. CTA negative for PE. Myovue done 04/17/21 normal no ischemia EF 60%  TTE 04/17/21 with EF 60-65% mean gradient 11 mmgH and described as mild/mod PVL There was no change from TTE done 09/07/20  Had to stop working at [employer] due to back pain     Past Medical History:  Diagnosis Date   CAD (coronary artery disease)    multivessel, primarily nonobstructive managed medically 2019   Chronic lower back pain    CKD (chronic kidney disease), stage III (HCC)    DDD (degenerative disc disease), lumbar    Essential tremor    Hyperlipidemia    Hypertension    S/P TAVR (transcatheter aortic valve  replacement) 06/03/2017   29 mm Edwards Sapien 3 transcatheter heart valve placed via percutaneous right transfemoral approach    Sarcoidosis    Severe aortic stenosis    s/p TAVR 05/2017   Thrombocytopenia ([place])     Past Surgical History:  Procedure Laterality Date   CARDIAC CATHETERIZATION  04/30/2017   COLONOSCOPY     HAMMER TOE SURGERY     "?toe; ?side"   INGUINAL HERNIA REPAIR Right    INTRAOPERATIVE TRANSTHORACIC ECHOCARDIOGRAM N/A 06/03/2017   Procedure: INTRAOPERATIVE TRANSTHORACIC ECHOCARDIOGRAM;  Surgeon: Burnell Blanks, MD;  Location: [facility];  Service: Open Heart Surgery;  Laterality: N/A;   LAPAROSCOPIC CHOLECYSTECTOMY     RIGHT/LEFT HEART CATH AND CORONARY ANGIOGRAPHY N/A 04/30/2017   Procedure: RIGHT/LEFT HEART CATH AND CORONARY ANGIOGRAPHY;  Surgeon: Burnell Blanks, MD;  Location: [facility];  Service: Cardiovascular;  Laterality: N/A;   SALIVARY STONE REMOVAL  1961   "spit gland had a stone on it"   TRANSCATHETER AORTIC VALVE REPLACEMENT, TRANSFEMORAL N/A 06/03/2017   Procedure: TRANSCATHETER AORTIC VALVE REPLACEMENT, TRANSFEMORAL;  Surgeon: Burnell Blanks, MD;  Location: [facility];  Service: Open Heart Surgery;  Laterality: N/A;    Current Medications: Current Meds  Medication Sig   amLODipine (NORVASC) 10 MG tablet Take 5 mg by mouth daily.   amoxicillin (AMOXIL) 500 MG tablet TAKE 4 TABLETS ONE HOUR BEFORE ANY DENTAL PROCEDURE.  aspirin EC 81 MG tablet Take 81 mg by mouth daily.   atorvastatin (LIPITOR) 10 MG tablet Take 10 mg by mouth daily.   clonazePAM (KLONOPIN) 0.5 MG tablet Take 0.5 mg by mouth 2 (two) times daily as needed for anxiety.   fluticasone (FLONASE) 50 MCG/ACT nasal spray Place 1 spray into both nostrils daily as needed for allergies.   ibuprofen (ADVIL) 800 MG tablet Take 1 tablet (800 mg total) by mouth every 8 (eight) hours as needed for moderate pain.   losartan (COZAAR) 25 MG tablet Take 12.5 mg by mouth daily.    traZODone (DESYREL) 50 MG tablet Take 50 mg by mouth at bedtime.   ursodiol (ACTIGALL) 250 MG tablet Take 750 mg by mouth 2 (two) times daily.     Allergies:   Patient has no known allergies.   Social History   Socioeconomic History   Marital status: Divorced    Spouse name: Not on file   Number of children: Not on file   Years of education: Not on file   Highest education level: Not on file  Occupational History   Occupation: [id number]  Tobacco Use   Smoking status: Never   Smokeless tobacco: Never  Vaping Use   Vaping Use: Never used  Substance and Sexual Activity   Alcohol use: No   Drug use: Never   Sexual activity: Not Currently  Other Topics Concern   Not on file  Social History Narrative   Not on file   Social Determinants of Health   Financial Resource Strain: Not on file  Food Insecurity: Not on file  Transportation Needs: Not on file  Physical Activity: Not on file  Stress: Not on file  Social Connections: Not on file     Family History: The patient's family history includes Breast cancer in his sister; CVA (age of onset: 73) in his father; Hypertension in his mother.  ROS:   Please see the history of present illness.     All other systems reviewed and are negative.  EKGs/Labs/Other Studies Reviewed:    The following studies were reviewed today:   2D echo 07/01/19    1. Left ventricular ejection fraction, by estimation, is 65 to 70%. The  left ventricle has normal function. The left ventricle has no regional  wall motion abnormalities. Left ventricular diastolic parameters are  consistent with Grade II diastolic  dysfunction (pseudonormalization). Elevated left ventricular end-diastolic  pressure.   2. Right ventricular systolic function is normal. The right ventricular  size is normal. There is normal pulmonary artery systolic pressure.   3. Left atrial size was severely dilated.   4. The mitral valve is normal in structure. Trivial mitral valve   regurgitation. No evidence of mitral stenosis.   5. There is a 29 mm Edwards Sapien prosthetic, stented (TAVR) valve  present in the aortic posiion. Echo findings are consistent with normal  structure and function of the aortic valve prosthesis. Mild perivalvular  leak seen in PSSA view at 1 o'clock.  Visually this appears mild but by PHT is moderate at 355msec. No aortic  stenosis is present. Aortic valve mean gradient measures 11.0 mmHg. Aortic  valve peak gradient measures 20.2 mmHg. Aortic valve area, by VTI measures  3.22 cm. DI 0.61.   6. Aortic dilatation noted. There is mild dilatation of the aortic root  and of the ascending aorta measuring 37 mm and 37mm repsectively.   7. The inferior vena cava is normal in size with greater  than 50%  respiratory variability, suggesting right atrial pressure of 3 mmHg.   8. WHen compared to study of 09/2018, the peak and mean transaortic  gradients have decreased from 34/36mmHg to 20/59mmHg and visually  perivalvular AI appears the same.   [facility] 04/2017 Ost RCA to Prox RCA lesion is 30% stenosed. Prox RCA to Mid RCA lesion is 20% stenosed. Ost RPDA lesion is 30% stenosed. Prox Cx lesion is 60% stenosed. Dist LAD lesion is 80% stenosed. Prox LAD lesion is 50% stenosed. Prox LAD to Mid LAD lesion is 30% stenosed. Ost 1st Diag lesion is 60% stenosed. There is severe aortic valve stenosis. Ost 2nd Mrg lesion is 30% stenosed. Mid Cx lesion is 30% stenosed.   1. The Left main has no obstructive disease 2. The LAD is a large caliber vessel in the proximal and mid vessel and then becomes smaller in caliber in the distal vessel. The mid LAD has a moderate stenosis that does not appear to be flow limiting. The distal LAD is smaller in caliber. There is a focal severe stenosis in the distal LAD. This vessel appears to be too small for stenting. 3. The Circumflex is a large caliber vessel with a moderate proximal stenosis just before the takeoff of a  large obtuse marginal branch. The large caliber first obtuse marginal branch is free of obstructive disease. 4. The RCA is a large, dominant vessel with mild ostial stenosis, mild disease in the mid and distal vessel. There is mild disease in the moderate caliber PDA. 5. Severe aortic stenosis (mean gradient 42.1 mmHg, peak to peak gradient 49 mmHg, AVA 1.2 cm2).   Recommendations: I think his CAD can be managed at this time with medical therapy. He is having no angina. The LAD lesion is distal and the vessel is small in caliber (not favorable for stenting) and this only supplies the apical segment of myocardium. The Circumflex lesion is moderate. Will proceed with workup for AVR vs TAVR. He has an appointment with Dr. Roxy Manns next week. I suspect that he will be a good TAVR candidate.      EKG:  EKG is ordered today.  The ekg ordered today demonstrates sinus with HR 81  Recent Labs: 03/08/2021: B Natriuretic Peptide 123.0; BUN 23; Creatinine, Ser 1.70; Hemoglobin 12.6; Platelets 99; Potassium 4.3; Sodium 139  Recent Lipid Panel No results found for: "CHOL", "TRIG", "HDL", "CHOLHDL", "VLDL", "LDLCALC", "LDLDIRECT"   Risk Assessment/Calculations:           Physical Exam:    VS:  BP 110/70   Pulse 62   Ht 6\' 2"  (1.88 m)   Wt 210 lb (95.3 kg)   SpO2 97%   BMI 26.96 kg/m     Wt Readings from Last 3 Encounters:  06/21/21 210 lb (95.3 kg)  04/17/21 213 lb (96.6 kg)  04/09/21 203 lb 9.6 oz (92.4 kg)    Affect appropriate Healthy:  appears stated age HEENT: normal Neck supple with no adenopathy JVP normal no bruits no thyromegaly Lungs clear with no wheezing and good diaphragmatic motion Heart:  S1/S2 SEM no AR murmur, no rub, gallop or click PMI normal Abdomen: benighn, BS positve, no tenderness, no AAA no bruit.  No HSM or HJR Distal pulses intact with no bruits No edema Neuro non-focal Skin warm and dry No muscular weakness   ASSESSMENT:    No diagnosis found.  PLAN:     In order of problems listed above:  Severe AS s/p TAVR:  TTE 04/17/21 stable with mean gradient 11 mmHg mild to moderate AR not heard on exam and normal EF f/u echo 04/2022   CAD: cath 04/30/17 prior to TAVR mainly distal LAD dx not suitable for intervention non ischemic myovue 04/17/21 observe    Essential HTN: Well controlled.  Continue current medications and low sodium Dash type diet.    Hyperlipidemia: continue statin   Pulmonary sarcoid with ILD: 02 sats dropped to 88% with walking  Primary etiology of dyspnea  CT 03/08/21 no PE but significant peripheral interstitial lung dx Seen by [facility] pulmonary 04/09/21 and pending f/u PFT;s and CT   F/U echo post TAVR 04/2022   Signed, Jenkins Rouge, MD  06/21/2021 8:51 AM    [place]

## 2021-06-21 ENCOUNTER — Ambulatory Visit: Payer: Medicare PPO | Admitting: Cardiovascular Disease

## 2021-06-21 ENCOUNTER — Encounter: Payer: Self-pay | Admitting: Cardiovascular Disease

## 2021-06-21 VITALS — BP 110/70 | HR 62 | Ht 74.0 in | Wt 210.0 lb

## 2021-06-21 DIAGNOSIS — E785 Hyperlipidemia, unspecified: Secondary | ICD-10-CM

## 2021-06-21 DIAGNOSIS — Z952 Presence of prosthetic heart valve: Secondary | ICD-10-CM | POA: Diagnosis not present

## 2021-06-21 DIAGNOSIS — D869 Sarcoidosis, unspecified: Secondary | ICD-10-CM | POA: Diagnosis not present

## 2021-06-21 DIAGNOSIS — I2510 Atherosclerotic heart disease of native coronary artery without angina pectoris: Secondary | ICD-10-CM

## 2021-06-21 DIAGNOSIS — R0602 Shortness of breath: Secondary | ICD-10-CM

## 2021-06-21 NOTE — Patient Instructions (Signed)

## 2021-08-27 ENCOUNTER — Ambulatory Visit (HOSPITAL_COMMUNITY)
Admission: RE | Admit: 2021-08-27 | Discharge: 2021-08-27 | Disposition: A | Discharge status: Home / Self Care | Payer: Medicare PPO | Source: Ambulatory Visit | Attending: Pulmonary Disease | Admitting: Pulmonary Disease

## 2021-08-27 DIAGNOSIS — I2510 Atherosclerotic heart disease of native coronary artery without angina pectoris: Secondary | ICD-10-CM | POA: Diagnosis not present

## 2021-08-27 DIAGNOSIS — Z952 Presence of prosthetic heart valve: Secondary | ICD-10-CM | POA: Diagnosis not present

## 2021-08-27 DIAGNOSIS — J849 Interstitial pulmonary disease, unspecified: Secondary | ICD-10-CM | POA: Insufficient documentation

## 2021-08-27 DIAGNOSIS — I700 Atherosclerosis of aorta: Secondary | ICD-10-CM | POA: Insufficient documentation

## 2021-08-27 DIAGNOSIS — I3481 Nonrheumatic mitral (valve) annulus calcification: Secondary | ICD-10-CM | POA: Insufficient documentation

## 2021-08-27 DIAGNOSIS — J984 Other disorders of lung: Secondary | ICD-10-CM | POA: Diagnosis present

## 2021-08-29 NOTE — Progress Notes (Signed)
Please set patient up for ILD consult with Dr. Marchelle Gearing.  You can let patient know that I have reviewed his recent ct chest and I think it would be best for him to see them in follow up to discuss results.   Thanks,  BLI  Josephine Igo, DO [facility] 08/29/2021 5:14 PM

## 2021-08-30 ENCOUNTER — Encounter: Payer: Self-pay | Admitting: *Deleted

## 2021-09-27 ENCOUNTER — Ambulatory Visit: Payer: Medicare PPO | Admitting: Pulmonary Disease

## 2021-09-27 ENCOUNTER — Encounter: Payer: Self-pay | Admitting: Pulmonary Disease

## 2021-09-27 VITALS — BP 130/68 | HR 72 | Temp 97.8°F | Ht 74.0 in | Wt 211.0 lb

## 2021-09-27 DIAGNOSIS — J849 Interstitial pulmonary disease, unspecified: Secondary | ICD-10-CM | POA: Diagnosis not present

## 2021-09-27 DIAGNOSIS — R0602 Shortness of breath: Secondary | ICD-10-CM

## 2021-09-27 LAB — COMPREHENSIVE METABOLIC PANEL
ALT: 9 U/L (ref 0–53)
AST: 18 U/L (ref 0–37)
Albumin: 4.4 g/dL (ref 3.5–5.2)
Alkaline Phosphatase: 71 U/L (ref 39–117)
BUN: 19 mg/dL (ref 6–23)
CO2: 29 mEq/L (ref 19–32)
Calcium: 9.7 mg/dL (ref 8.4–10.5)
Chloride: 104 mEq/L (ref 96–112)
Creatinine, Ser: 1.81 mg/dL — ABNORMAL HIGH (ref 0.40–1.50)
GFR: 35.37 mL/min — ABNORMAL LOW (ref >60.00)
Glucose, Bld: 98 mg/dL (ref 70–99)
Potassium: 4.5 mEq/L (ref 3.5–5.1)
Sodium: 140 mEq/L (ref 135–145)
Total Bilirubin: 2.2 mg/dL — ABNORMAL HIGH (ref 0.2–1.2)
Total Protein: 7.7 g/dL (ref 6.0–8.3)

## 2021-09-27 LAB — CBC WITH DIFFERENTIAL/PLATELET
Basophils Absolute: 0 10*3/uL (ref 0.0–0.1)
Basophils Relative: 0.5 % (ref 0.0–3.0)
Eosinophils Absolute: 0 10*3/uL (ref 0.0–0.7)
Eosinophils Relative: 0.9 % (ref 0.0–5.0)
HCT: 37.8 % — ABNORMAL LOW (ref 39.0–52.0)
Hemoglobin: 12.8 g/dL — ABNORMAL LOW (ref 13.0–17.0)
Lymphocytes Relative: 19.2 % (ref 12.0–46.0)
Lymphs Abs: 1 10*3/uL (ref 0.7–4.0)
MCHC: 33.8 g/dL (ref 30.0–36.0)
MCV: 97.2 fl (ref 78.0–100.0)
Monocytes Absolute: 0.4 10*3/uL (ref 0.1–1.0)
Monocytes Relative: 7.1 % (ref 3.0–12.0)
Neutro Abs: 3.7 10*3/uL (ref 1.4–7.7)
Neutrophils Relative %: 72.3 % (ref 43.0–77.0)
Platelets: 85 10*3/uL — ABNORMAL LOW (ref 150.0–400.0)
RBC: 3.89 Mil/uL — ABNORMAL LOW (ref 4.22–5.81)
RDW: 13.8 % (ref 11.5–15.5)
WBC: 5.1 10*3/uL (ref 4.0–10.5)

## 2021-09-27 NOTE — Progress Notes (Signed)
Jason Anthony    [id number]    10-09-1945  Primary Care Physician:Clark, Armstead Peaks, NP  Referring Physician: Frederic Jerich[place], [place] [address]  Chief complaint: Consult for interstitial lung disease  HPI: 76 y.o. who  has a past medical history of CAD (coronary artery disease), Chronic lower back pain, CKD (chronic kidney disease), stage III ([place]), DDD (degenerative disc disease), lumbar, Essential tremor, Hyperlipidemia, Hypertension, S/P TAVR (transcatheter aortic valve replacement) (06/03/2017), Sarcoidosis, Severe aortic stenosis, and Thrombocytopenia ([place]).   He has been referred for evaluation of interstitial lung disease noted on CT scan.  Complains of gradual increase in dyspnea on exertion.  Denies any wheeze or cough, chest congestion History notable for exposure to carbide dust as noted below.  He was told that he had sarcoidosis in the 1970s based on lung imaging and no biopsy was done.  He did not get any specific treatment for sarcoidosis  PFTs in 2019 in preparation for TAVR showed restriction and severe diffusion impairment.  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  Though he wore a mask he still had inhalation of material.  No mold, hot tub, Jacuzzi.  No feather pillows or comforters. Smoking history: Never smoker Travel history: From [place].  No significant travel Relevant family history: No family history of lung disease  Outpatient Encounter Medications as of 09/27/2021  Medication Sig   amLODipine (NORVASC) 10 MG tablet Take 5 mg by mouth daily.   amoxicillin (AMOXIL) 500 MG tablet TAKE 4 TABLETS ONE HOUR BEFORE ANY DENTAL PROCEDURE.   aspirin EC 81 MG tablet Take 81 mg by mouth daily.   atorvastatin (LIPITOR) 10 MG tablet Take 10 mg by mouth daily.    clonazePAM (KLONOPIN) 0.5 MG tablet Take 0.5 mg by mouth 2 (two) times daily as needed for anxiety.   fluticasone (FLONASE) 50 MCG/ACT nasal spray Place 1 spray into both nostrils daily as needed for allergies.   ibuprofen (ADVIL) 800 MG tablet Take 1 tablet (800 mg total) by mouth every 8 (eight) hours as needed for moderate pain.   losartan (COZAAR) 25 MG tablet Take 12.5 mg by mouth daily.   traZODone (DESYREL) 50 MG tablet Take 50 mg by mouth at bedtime.   ursodiol (ACTIGALL) 250 MG tablet Take 750 mg by mouth 2 (two) times daily.   amLODipine (NORVASC) 10 MG tablet Take 1 tablet (10 mg total) by mouth daily. (Patient not taking: Reported on 06/21/2021)   losartan (COZAAR) 25 MG tablet Take 1 tablet (25 mg total) by mouth daily. (Patient not taking: Reported on 06/21/2021)   No facility-administered encounter medications on file as of 09/27/2021.    Allergies as of 09/27/2021   (No Known Allergies)    Past Medical History:  Diagnosis Date   CAD (coronary artery disease)    multivessel, primarily nonobstructive managed medically 2019   Chronic lower back pain    CKD (chronic kidney disease), stage III (HCC)    DDD (degenerative disc disease), lumbar    Essential tremor    Hyperlipidemia    Hypertension    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   29 mm Edwards Sapien 3 transcatheter heart valve placed via percutaneous right transfemoral approach    Sarcoidosis    Severe aortic stenosis  s/p TAVR 05/2017   Thrombocytopenia [facility])     Past Surgical History:  Procedure Laterality Date   CARDIAC CATHETERIZATION  04/30/2017   COLONOSCOPY     HAMMER TOE SURGERY     "?toe; ?side"   INGUINAL HERNIA REPAIR Right    INTRAOPERATIVE TRANSTHORACIC ECHOCARDIOGRAM N/A 06/03/2017   Procedure: INTRAOPERATIVE TRANSTHORACIC ECHOCARDIOGRAM;  Surgeon: Burnell Blanks, MD;  Location: [facility];  Service: Open Heart Surgery;  Laterality: N/A;   LAPAROSCOPIC CHOLECYSTECTOMY      RIGHT/LEFT HEART CATH AND CORONARY ANGIOGRAPHY N/A 04/30/2017   Procedure: RIGHT/LEFT HEART CATH AND CORONARY ANGIOGRAPHY;  Surgeon: Burnell Blanks, MD;  Location: [facility];  Service: Cardiovascular;  Laterality: N/A;   SALIVARY STONE REMOVAL  1961   "spit gland had a stone on it"   TRANSCATHETER AORTIC VALVE REPLACEMENT, TRANSFEMORAL N/A 06/03/2017   Procedure: TRANSCATHETER AORTIC VALVE REPLACEMENT, TRANSFEMORAL;  Surgeon: Burnell Blanks, MD;  Location: [facility];  Service: Open Heart Surgery;  Laterality: N/A;    Family History  Problem Relation Age of Onset   Hypertension Mother    CVA Father 34   Breast cancer Sister     Social History   Socioeconomic History   Marital status: Divorced    Spouse name: Not on file   Number of children: Not on file   Years of education: Not on file   Highest education level: Not on file  Occupational History   Occupation: [employer]  Tobacco Use   Smoking status: Never   Smokeless tobacco: Never  Vaping Use   Vaping Use: Never used  Substance and Sexual Activity   Alcohol use: No   Drug use: Never   Sexual activity: Not Currently  Other Topics Concern   Not on file  Social History Narrative   Not on file   Social Determinants of Health   Financial Resource Strain: Not on file  Food Insecurity: Not on file  Transportation Needs: Not on file  Physical Activity: Not on file  Stress: Not on file  Social Connections: Not on file  Intimate Partner Violence: Not on file    Review of systems: Review of Systems  Constitutional: Negative for fever and chills.  HENT: Negative.   Eyes: Negative for blurred vision.  Respiratory: as per HPI  Cardiovascular: Negative for chest pain and palpitations.  Gastrointestinal: Negative for vomiting, diarrhea, blood per rectum. Genitourinary: Negative for dysuria, urgency, frequency and hematuria.  Musculoskeletal: Negative for myalgias, back pain and joint pain.  Skin:  Negative for itching and rash.  Neurological: Negative for dizziness, tremors, focal weakness, seizures and loss of consciousness.  Endo/Heme/Allergies: Negative for environmental allergies.  Psychiatric/Behavioral: Negative for depression, suicidal ideas and hallucinations.  All other systems reviewed and are negative.  Physical Exam: Blood pressure 130/68, pulse 72, temperature 97.8 F (36.6 C), temperature source Oral, height 6\' 2"  (1.88 m), weight 211 lb (95.7 kg), SpO2 99 %. Gen:      No acute distress HEENT:  EOMI, sclera anicteric Neck:     No masses; no thyromegaly Lungs:    Clear to auscultation bilaterally; normal respiratory effort CV:         Regular rate and rhythm; no murmurs Abd:      + bowel sounds; soft, non-tender; no palpable masses, no distension Ext:    No edema; adequate peripheral perfusion Skin:      Warm and dry; no rash Neuro: alert and oriented x 3 Psych: normal mood and affect  Data Reviewed: Imaging: CT 05/20/17-bilateral interstitial lung disease High resolution CT 08/27/2021-fibrotic changes in the lungs with no significant basal gradient.  Patchy groundglass attenuation, mild bronchiectasis, bronchiolectasis.  Alternate diagnosis/UIP. I have reviewed the images personally.  PFTs: 05/20/2017 FVC 3.06 [61%], FEV1 2.71 [74%], TLC 4.91 [62%], DLCO 17.58 [46%] Severe restriction, diffusion impairment  Labs:  Assessment:  Assessment for interstitial lung disease Review of CT scan shows fibrotic changes with no significant basal gradient.  This could very well be IPF but he does have significant exposure history to carbide test in the past and was told he has sarcoidosis.  His high-res CT is read as mild progression but on my read seems to be largely stable.  However by symptoms he does endorse progressive dyspnea  We had a detailed discussion today about possible etiologies and trajectory of disease.  I would not recommend a biopsy given old age and frailty  with significant diffusion impairment.  Discussed empiric treatment with antifibrotic's but he would like to hold off for now  We will get baseline serologies for evaluation of connective tissue disease.   Schedule PFTs and follow-up in clinic.  He cannot make the trip in the next few months since he needs to get somebody to drive him.  I will follow back with him in 6 months  Plan/Recommendations: Labs PFTs  Marshell Garfinkel MD [facility] 09/27/2021, 9:45 AM  CC: Frederic Jericho, NP

## 2021-09-27 NOTE — Patient Instructions (Signed)
We will get him some labs today including level 1 connective tissue disease, CMP, CBC with differential, proBNP for dyspnea Schedule PFTs in 6 months Follow-up in 6 months after PFTs on the same day

## 2021-09-28 LAB — PRO B NATRIURETIC PEPTIDE: NT-Pro BNP: 119 pg/mL (ref 0–486)

## 2021-10-01 LAB — CYCLIC CITRUL PEPTIDE ANTIBODY, IGG: Cyclic Citrullin Peptide Ab: <16 UNITS

## 2021-10-01 LAB — ANTI-NUCLEAR AB-TITER (ANA TITER)
ANA TITER: 1:40 {titer} — ABNORMAL HIGH
ANA Titer 1: 1:40 {titer} — ABNORMAL HIGH

## 2021-10-01 LAB — ANA: Anti Nuclear Antibody (ANA): POSITIVE — AB

## 2021-10-01 LAB — RHEUMATOID FACTOR: Rheumatoid fact SerPl-aCnc: 17 IU/mL — ABNORMAL HIGH (ref <14)

## 2021-10-01 LAB — ANTI-DNA ANTIBODY, DOUBLE-STRANDED: ds DNA Ab: <1 IU/mL

## 2022-03-19 ENCOUNTER — Telehealth: Payer: Self-pay | Admitting: Cardiovascular Disease

## 2022-03-19 NOTE — Telephone Encounter (Signed)
Unable to leave voicemail.

## 2022-03-19 NOTE — Telephone Encounter (Signed)
Pt states he fell face first last and got two black eyes from it about a week ago. He states he is unsure if he passed out or fainted. Kidney doctor took his bp that day it was 137/7? (Could not really recall the bottom number. He states they took it again and it was 110/65.   Discontinued losartan  Still taking amlodipine '5mg'$ .   Pt is scheduled for his yearly appt but he is unsure what he should do in the meantime to prevent this. Please advise.

## 2022-03-21 NOTE — Telephone Encounter (Signed)
Patient stated while he was getting out of the car he passed out. He was evaluated at [facility] in [place]. Pt stated the physician told him his kidney function has decreased. He was advised to discontinue taking losartan and decrease amlodipine to half tablet. He does monitor blood pressure 3/13 1245 147/69 HR 63, 2000 138/63 HR 58.   Patient is currently asymptomatic. Will forward to MD and nurse.

## 2022-07-09 NOTE — Progress Notes (Signed)
Cardiology Office Note:    Date:  07/19/2022   ID:  Jason Anthony, DOB January 10, 1945, MRN [id number]  PCP:  Jason Pippins, NP   [facility] Providers Cardiologist:  Jason Haws, MD     Referring MD: Jason Pippins, NP   Tachycardia, SOB  History of Present Illness:    Jason Anthony is a 77 y.o. 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 Post operative echo showed EF 65% and a normally functioning TAVR valve with trivial PVL and mildly elevated transvalvular mean gradient of 15 mm Hg. He was discharged on ASA and Plavix.   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 He ruled out for MI by enzymes and ECG. CTA negative for PE. Myovue done 04/17/21 normal no ischemia EF 60%  TTE 04/17/21 with EF 60-65% mean gradient 11 mmgH and described as mild/mod PVL There was no change from TTE done 09/07/20  Had to stop working at [employer] due to back pain   03/19/22 syncope Evaluated at [facility] in [place] R/O dehydrated with elevated Cr/BUN Losartan d/c and amlodipine decreased Our most recent Cr was 1.8 as baseline   Feels well now Some postural changes in BP    Past Medical History:  Diagnosis Date   CAD (coronary artery disease)    multivessel, primarily nonobstructive managed medically  2019   Chronic lower back pain    CKD (chronic kidney disease), stage III (HCC)    DDD (degenerative disc disease), lumbar    Essential tremor    Hyperlipidemia    Hypertension    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   29 mm Edwards Sapien 3 transcatheter heart valve placed via percutaneous right transfemoral approach    Sarcoidosis    Severe aortic stenosis    s/p TAVR 05/2017   Thrombocytopenia (HCC)     Past Surgical History:  Procedure Laterality Date   CARDIAC CATHETERIZATION  04/30/2017   COLONOSCOPY     HAMMER TOE SURGERY     "?toe; ?side"   INGUINAL HERNIA REPAIR Right    INTRAOPERATIVE TRANSTHORACIC ECHOCARDIOGRAM N/A 06/03/2017   Procedure: INTRAOPERATIVE TRANSTHORACIC ECHOCARDIOGRAM;  Surgeon: Kathleene Hazel, MD;  Location: [facility] OR;  Service: Open Heart Surgery;  Laterality: N/A;   LAPAROSCOPIC CHOLECYSTECTOMY     RIGHT/LEFT HEART CATH AND CORONARY ANGIOGRAPHY N/A 04/30/2017   Procedure: RIGHT/LEFT HEART CATH AND CORONARY ANGIOGRAPHY;  Surgeon: Kathleene Hazel, MD;  Location: [facility] INVASIVE CV LAB;  Service: Cardiovascular;  Laterality: N/A;   SALIVARY STONE REMOVAL  1961   "spit gland had a stone on it"   TRANSCATHETER AORTIC VALVE REPLACEMENT, TRANSFEMORAL N/A 06/03/2017   Procedure: TRANSCATHETER AORTIC VALVE REPLACEMENT, TRANSFEMORAL;  Surgeon: Kathleene Hazel, MD;  Location: [facility] OR;  Service: Open Heart Surgery;  Laterality: N/A;  Current Medications: Current Meds  Medication Sig   amLODipine (NORVASC) 10 MG tablet Take 5 mg by mouth daily.   amoxicillin (AMOXIL) 500 MG tablet TAKE 4 TABLETS ONE HOUR BEFORE ANY DENTAL PROCEDURE.   aspirin EC 81 MG tablet Take 81 mg by mouth daily.   atorvastatin (LIPITOR) 10 MG tablet Take 10 mg by mouth daily.   clonazePAM (KLONOPIN) 0.5 MG tablet Take 0.5 mg by mouth 2 (two) times daily as needed for anxiety.   fluticasone (FLONASE) 50 MCG/ACT nasal spray Place 1 spray into both  nostrils daily as needed for allergies.   losartan (COZAAR) 25 MG tablet Take 12.5 mg by mouth daily.   traZODone (DESYREL) 50 MG tablet Take 50 mg by mouth at bedtime.   [DISCONTINUED] ibuprofen (ADVIL) 800 MG tablet Take 1 tablet (800 mg total) by mouth every 8 (eight) hours as needed for moderate pain.   [DISCONTINUED] ursodiol (ACTIGALL) 250 MG tablet Take 750 mg by mouth 2 (two) times daily.     Allergies:   Patient has no known allergies.   Social History   Socioeconomic History   Marital status: Divorced    Spouse name: Not on file   Number of children: Not on file   Years of education: Not on file   Highest education level: Not on file  Occupational History   Occupation: [id number]  Tobacco Use   Smoking status: Never   Smokeless tobacco: Never  Vaping Use   Vaping status: Never Used  Substance and Sexual Activity   Alcohol use: No   Drug use: Never   Sexual activity: Not Currently  Other Topics Concern   Not on file  Social History Narrative   Not on file   Social Determinants of Health   Financial Resource Strain: Not on file  Food Insecurity: Not on file  Transportation Needs: Not on file  Physical Activity: Not on file  Stress: Not on file  Social Connections: Not on file     Family History: The patient's family history includes Breast cancer in his sister; CVA (age of onset: 76) in his father; Hypertension in his mother.  ROS:   Please see the history of present illness.     All other systems reviewed and are negative.  EKGs/Labs/Other Studies Reviewed:    The following studies were reviewed today:   2D echo 07/01/19    1. Left ventricular ejection fraction, by estimation, is 65 to 70%. The  left ventricle has normal function. The left ventricle has no regional  wall motion abnormalities. Left ventricular diastolic parameters are  consistent with Grade II diastolic  dysfunction (pseudonormalization). Elevated left ventricular end-diastolic   pressure.   2. Right ventricular systolic function is normal. The right ventricular  size is normal. There is normal pulmonary artery systolic pressure.   3. Left atrial size was severely dilated.   4. The mitral valve is normal in structure. Trivial mitral valve  regurgitation. No evidence of mitral stenosis.   5. There is a 29 mm Edwards Sapien prosthetic, stented (TAVR) valve  present in the aortic posiion. Echo findings are consistent with normal  structure and function of the aortic valve prosthesis. Mild perivalvular  leak seen in PSSA view at 1 o'clock.  Visually this appears mild but by PHT is moderate at . No aortic  stenosis is present. Aortic valve mean gradient measures 11.0 mmHg. Aortic  valve peak gradient measures 20.2 mmHg. Aortic valve area, by VTI measures  3.22 cm. DI  0.61.   6. Aortic dilatation noted. There is mild dilatation of the aortic root  and of the ascending aorta measuring 37 mm and 40mm repsectively.   7. The inferior vena cava is normal in size with greater than 50%  respiratory variability, suggesting right atrial pressure of 3 mmHg.   8. WHen compared to study of 09/2018, the peak and mean transaortic  gradients have decreased from 34/51mmHg to 20/91mmHg and visually  perivalvular AI appears the same.   [facility] 04/2017 Ost RCA to Prox RCA lesion is 30% stenosed. Prox RCA to Mid RCA lesion is 20% stenosed. Ost RPDA lesion is 30% stenosed. Prox Cx lesion is 60% stenosed. Dist LAD lesion is 80% stenosed. Prox LAD lesion is 50% stenosed. Prox LAD to Mid LAD lesion is 30% stenosed. Ost 1st Diag lesion is 60% stenosed. There is severe aortic valve stenosis. Ost 2nd Mrg lesion is 30% stenosed. Mid Cx lesion is 30% stenosed.   1. The Left main has no obstructive disease 2. The LAD is a large caliber vessel in the proximal and mid vessel and then becomes smaller in caliber in the distal vessel. The mid LAD has a moderate stenosis that does not appear  to be flow limiting. The distal LAD is smaller in caliber. There is a focal severe stenosis in the distal LAD. This vessel appears to be too small for stenting. 3. The Circumflex is a large caliber vessel with a moderate proximal stenosis just before the takeoff of a large obtuse marginal branch. The large caliber first obtuse marginal branch is free of obstructive disease. 4. The RCA is a large, dominant vessel with mild ostial stenosis, mild disease in the mid and distal vessel. There is mild disease in the moderate caliber PDA. 5. Severe aortic stenosis (mean gradient 42.1 mmHg, peak to peak gradient 49 mmHg, AVA 1.2 cm2).   Recommendations: I think his CAD can be managed at this time with medical therapy. He is having no angina. The LAD lesion is distal and the vessel is small in caliber (not favorable for stenting) and this only supplies the apical segment of myocardium. The Circumflex lesion is moderate. Will proceed with workup for AVR vs TAVR. He has an appointment with Dr. Cornelius Moras next week. I suspect that he will be a good TAVR candidate.      EKG:  EKG is ordered today.  The ekg ordered today demonstrates sinus with HR 81  Recent Labs: 09/27/2021: ALT 9; BUN 19; Creatinine, Ser 1.81; Hemoglobin 12.8; NT-Pro BNP 119; Platelets 85.0; Potassium 4.5; Sodium 140  Recent Lipid Panel No results found for: "CHOL", "TRIG", "HDL", "CHOLHDL", "VLDL", "LDLCALC", "LDLDIRECT"   Physical Exam:    VS:  BP 132/80   Pulse 65   Ht 6\' 2"  (1.88 m)   Wt 210 lb 6.4 oz (95.4 kg)   SpO2 95%   BMI 27.01 kg/m     Wt Readings from Last 3 Encounters:  07/19/22 210 lb 6.4 oz (95.4 kg)  09/27/21 211 lb (95.7 kg)  06/21/21 210 lb (95.3 kg)    Affect appropriate Healthy:  appears stated age HEENT: normal Neck supple with no adenopathy JVP normal no bruits no thyromegaly Lungs interstitial crackles at base Heart:  S1/S2 SEM no AR murmur, no rub, gallop or click PMI normal Abdomen: benighn, BS positve,  no tenderness, no AAA no bruit.  No HSM or HJR Distal pulses intact with no bruits No edema Neuro non-focal Skin warm and dry No muscular  weakness    PLAN:    In order of problems listed above:  Severe AS s/p TAVR:  TTE 04/17/21 stable with mean gradient 11 mmHg mild to moderate AR not heard on exam and normal EF Update TTE    CAD: cath 04/30/17 prior to TAVR mainly distal LAD dx not suitable for intervention non ischemic myovue 04/17/21 observe    Essential HTN: Well controlled.  Continue current medications and low sodium Dash type diet.    Hyperlipidemia: continue statin   Pulmonary sarcoid with ILD: 02 sats dropped to 88% with walking  Primary etiology of dyspnea  CT 03/08/21 no PE but significant peripheral interstitial lung dx Seen by [facility] pulmonary 04/09/21  CT 08/28/21 similar fibrotic changes   CRF:  update BMET Evaluation Sovah ARB d/c Cr 1.8 last in Epic    Thrombocytopenia:  chronic f/u hematology CBC/PLT today  F/U echo post TAVR  BMET  Signed, Jason Haws, MD  07/19/2022 9:47 AM    [facility]

## 2022-07-19 ENCOUNTER — Encounter: Payer: Self-pay | Admitting: Cardiovascular Disease

## 2022-07-19 ENCOUNTER — Ambulatory Visit: Payer: Medicare PPO | Attending: Cardiovascular Disease | Admitting: Cardiovascular Disease

## 2022-07-19 VITALS — BP 132/80 | HR 65 | Ht 74.0 in | Wt 210.4 lb

## 2022-07-19 DIAGNOSIS — D696 Thrombocytopenia, unspecified: Secondary | ICD-10-CM | POA: Diagnosis not present

## 2022-07-19 DIAGNOSIS — I10 Essential (primary) hypertension: Secondary | ICD-10-CM

## 2022-07-19 DIAGNOSIS — Z952 Presence of prosthetic heart valve: Secondary | ICD-10-CM | POA: Diagnosis not present

## 2022-07-19 DIAGNOSIS — I2510 Atherosclerotic heart disease of native coronary artery without angina pectoris: Secondary | ICD-10-CM

## 2022-07-19 LAB — CBC WITH DIFFERENTIAL/PLATELET

## 2022-07-19 LAB — BASIC METABOLIC PANEL
BUN: 20 mg/dL (ref 8–27)
CO2: 23 mmol/L (ref 20–29)
Calcium: 9.1 mg/dL (ref 8.6–10.2)
Chloride: 105 mmol/L (ref 96–106)
Glucose: 104 mg/dL — ABNORMAL HIGH (ref 70–99)
Sodium: 144 mmol/L (ref 134–144)
eGFR: 31 mL/min/{1.73_m2} — ABNORMAL LOW (ref >59)

## 2022-07-19 NOTE — Patient Instructions (Addendum)
Medication Instructions:  Your physician recommends that you continue on your current medications as directed. Please refer to the Current Medication list given to you today.  *If you need a refill on your cardiac medications before your next appointment, please call your pharmacy*  Lab Work: Your physician recommends that you have lab work today- BMET and CBC.  If you have labs (blood work) drawn today and your tests are completely normal, you will receive your results only by: MyChart Message (if you have MyChart) OR A paper copy in the mail If you have any lab test that is abnormal or we need to change your treatment, we will call you to review the results.  Testing/Procedures: Your physician has requested that you have an echocardiogram. Echocardiography is a painless test that uses sound waves to create images of your heart. It provides your doctor with information about the size and shape of your heart and how well your heart's chambers and valves are working. This procedure takes approximately one hour. There are no restrictions for this procedure. Please do NOT wear cologne, perfume, aftershave, or lotions (deodorant is allowed). Please arrive 15 minutes prior to your appointment time.  Follow-Up: At [facility], you and your health needs are our priority.  As part of our continuing mission to provide you with exceptional heart care, we have created designated Provider Care Teams.  These Care Teams include your primary Cardiologist (physician) and Advanced Practice Providers (APPs -  Physician Assistants and Nurse Practitioners) who all work together to provide you with the care you need, when you need it.  We recommend signing up for the patient portal called "MyChart".  Sign up information is provided on this After Visit Summary.  MyChart is used to connect with patients for Virtual Visits (Telemedicine).  Patients are able to view lab/test results, encounter notes, upcoming  appointments, etc.  Non-urgent messages can be sent to your provider as well.   To learn more about what you can do with MyChart, go to [email or web address].    Your next appointment:   1 year(s)  Provider:   Charlton Haws, MD

## 2022-07-20 LAB — CBC WITH DIFFERENTIAL/PLATELET
Basos: 1 %
Eos: 2 %
Hemoglobin: 12.7 g/dL — ABNORMAL LOW (ref 13.0–17.7)
Immature Grans (Abs): 0 10*3/uL (ref 0.0–0.1)
Immature Granulocytes: 0 %
Lymphocytes Absolute: 1 10*3/uL (ref 0.7–3.1)
Monocytes: 8 %
RDW: 12.7 % (ref 11.6–15.4)

## 2022-07-20 LAB — BASIC METABOLIC PANEL
BUN/Creatinine Ratio: 9 — ABNORMAL LOW (ref 10–24)
Creatinine, Ser: 2.14 mg/dL — ABNORMAL HIGH (ref 0.76–1.27)
Potassium: 4 mmol/L (ref 3.5–5.2)

## 2022-08-27 ENCOUNTER — Telehealth: Payer: Self-pay | Admitting: Cardiovascular Disease

## 2022-08-27 ENCOUNTER — Ambulatory Visit (HOSPITAL_COMMUNITY)
Admission: RE | Admit: 2022-08-27 | Discharge: 2022-08-27 | Disposition: A | Discharge status: Home / Self Care | Payer: Medicare PPO | Source: Ambulatory Visit | Attending: Cardiovascular Disease | Admitting: Cardiovascular Disease

## 2022-08-27 DIAGNOSIS — Z952 Presence of prosthetic heart valve: Secondary | ICD-10-CM

## 2022-08-27 DIAGNOSIS — I10 Essential (primary) hypertension: Secondary | ICD-10-CM | POA: Insufficient documentation

## 2022-08-27 LAB — ECHOCARDIOGRAM COMPLETE
AR max vel: 1.41 cm2
AV Area VTI: 1.66 cm2
AV Area mean vel: 1.58 cm2
AV Mean grad: 12.7 mmHg
AV Peak grad: 23.3 mmHg
Ao pk vel: 2.41 m/s
Area-P 1/2: 1.74 cm2
P 1/2 time: 517 msec
S' Lateral: 3.1 cm

## 2022-08-27 NOTE — Telephone Encounter (Signed)
Patient returned RN's call. 

## 2022-08-27 NOTE — Telephone Encounter (Signed)
  Jason Stade, MD 08/27/2022 12:35 PM EDT     EF normal TAVR valve > 79 years old and is functioning well. AR looks more mild / stable to me and not clear that it is PVL and not central F/U echo in a year   Called patient with result. Patient verbalized understanding.

## 2022-08-27 NOTE — Progress Notes (Signed)
*  PRELIMINARY RESULTS* Echocardiogram 2D Echocardiogram has been performed.  Stacey Drain 08/27/2022, 11:15 AM

## 2024-02-08 DEATH — deceased
